# Patient Record
Sex: Female | Born: 1965
Health system: Southern US, Community
[De-identification: ages and names within clinical notes are randomized; demographics above are authoritative.]

## PROBLEM LIST (undated history)

## (undated) DIAGNOSIS — I10 Essential (primary) hypertension: Secondary | ICD-10-CM

## (undated) HISTORY — DX: Essential (primary) hypertension: I10

## (undated) HISTORY — PX: TUBAL LIGATION: SHX77

## (undated) HISTORY — PX: DENTAL SURGERY: SHX609

---

## 2001-01-01 ENCOUNTER — Encounter: Payer: Self-pay | Admitting: Emergency Medicine

## 2001-01-01 ENCOUNTER — Emergency Department (HOSPITAL_COMMUNITY): Admission: EM | Admit: 2001-01-01 | Discharge: 2001-01-01 | Payer: Self-pay | Admitting: *Deleted

## 2001-03-10 ENCOUNTER — Emergency Department (HOSPITAL_COMMUNITY): Admission: EM | Admit: 2001-03-10 | Discharge: 2001-03-10 | Payer: Self-pay | Admitting: Emergency Medicine

## 2003-01-26 ENCOUNTER — Ambulatory Visit (HOSPITAL_COMMUNITY): Admission: RE | Admit: 2003-01-26 | Discharge: 2003-01-26 | Payer: Self-pay | Admitting: Obstetrics and Gynecology

## 2003-01-26 ENCOUNTER — Encounter: Payer: Self-pay | Admitting: Obstetrics and Gynecology

## 2004-12-16 ENCOUNTER — Emergency Department (HOSPITAL_COMMUNITY): Admission: EM | Admit: 2004-12-16 | Discharge: 2004-12-16 | Payer: Self-pay | Admitting: Emergency Medicine

## 2005-04-07 ENCOUNTER — Emergency Department (HOSPITAL_COMMUNITY): Admission: EM | Admit: 2005-04-07 | Discharge: 2005-04-07 | Payer: Self-pay | Admitting: Emergency Medicine

## 2006-01-21 ENCOUNTER — Ambulatory Visit (HOSPITAL_COMMUNITY): Admission: RE | Admit: 2006-01-21 | Discharge: 2006-01-21 | Payer: Self-pay | Admitting: Obstetrics and Gynecology

## 2007-05-29 ENCOUNTER — Ambulatory Visit (HOSPITAL_COMMUNITY): Admission: RE | Admit: 2007-05-29 | Discharge: 2007-05-29 | Payer: Self-pay | Admitting: Obstetrics and Gynecology

## 2007-09-09 ENCOUNTER — Emergency Department (HOSPITAL_COMMUNITY): Admission: EM | Admit: 2007-09-09 | Discharge: 2007-09-10 | Payer: Self-pay | Admitting: Emergency Medicine

## 2008-06-02 ENCOUNTER — Ambulatory Visit (HOSPITAL_COMMUNITY): Admission: RE | Admit: 2008-06-02 | Discharge: 2008-06-02 | Payer: Self-pay | Admitting: Internal Medicine

## 2008-06-08 ENCOUNTER — Emergency Department (HOSPITAL_COMMUNITY): Admission: EM | Admit: 2008-06-08 | Discharge: 2008-06-08 | Payer: Self-pay | Admitting: Emergency Medicine

## 2009-06-08 ENCOUNTER — Ambulatory Visit (HOSPITAL_COMMUNITY): Admission: RE | Admit: 2009-06-08 | Discharge: 2009-06-08 | Payer: Self-pay | Admitting: Internal Medicine

## 2010-02-06 ENCOUNTER — Emergency Department (HOSPITAL_COMMUNITY)
Admission: EM | Admit: 2010-02-06 | Discharge: 2010-02-06 | Payer: Self-pay | Source: Home / Self Care | Admitting: Emergency Medicine

## 2010-02-24 ENCOUNTER — Encounter (HOSPITAL_COMMUNITY)
Admission: RE | Admit: 2010-02-24 | Discharge: 2010-03-26 | Payer: Self-pay | Source: Home / Self Care | Admitting: Preventative Medicine

## 2011-05-10 LAB — RAPID STREP SCREEN (MED CTR MEBANE ONLY): Streptococcus, Group A Screen (Direct): NEGATIVE

## 2011-05-10 LAB — STREP A DNA PROBE

## 2011-05-21 LAB — BASIC METABOLIC PANEL
BUN: 7
CO2: 27
Calcium: 9.3
Creatinine, Ser: 0.74
GFR calc non Af Amer: >60
Glucose, Bld: 89
Potassium: 3.7

## 2011-05-21 LAB — DIFFERENTIAL
Eosinophils Relative: 0
Lymphocytes Relative: 29
Lymphs Abs: 1.6
Monocytes Relative: 6
Neutro Abs: 3.6

## 2011-05-21 LAB — CBC
Hemoglobin: 11.4 — ABNORMAL LOW
RDW: 14.1
WBC: 5.6
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
## 2011-11-28 ENCOUNTER — Other Ambulatory Visit (HOSPITAL_COMMUNITY): Payer: Self-pay | Admitting: Internal Medicine

## 2011-11-28 DIAGNOSIS — Z139 Encounter for screening, unspecified: Secondary | ICD-10-CM

## 2011-12-04 ENCOUNTER — Ambulatory Visit (HOSPITAL_COMMUNITY): Payer: Self-pay

## 2011-12-10 ENCOUNTER — Ambulatory Visit (HOSPITAL_COMMUNITY)
Admission: RE | Admit: 2011-12-10 | Discharge: 2011-12-10 | Disposition: A | Discharge status: Home / Self Care | Payer: 59 | Source: Ambulatory Visit | Attending: Internal Medicine | Admitting: Internal Medicine

## 2011-12-10 DIAGNOSIS — Z139 Encounter for screening, unspecified: Secondary | ICD-10-CM

## 2011-12-10 DIAGNOSIS — Z1231 Encounter for screening mammogram for malignant neoplasm of breast: Secondary | ICD-10-CM | POA: Insufficient documentation

## 2017-11-21 ENCOUNTER — Ambulatory Visit (INDEPENDENT_AMBULATORY_CARE_PROVIDER_SITE_OTHER): Payer: BLUE CROSS/BLUE SHIELD | Admitting: Adult Health

## 2017-11-21 ENCOUNTER — Other Ambulatory Visit: Payer: Self-pay | Admitting: Adult Health

## 2017-11-21 ENCOUNTER — Encounter (INDEPENDENT_AMBULATORY_CARE_PROVIDER_SITE_OTHER): Payer: Self-pay

## 2017-11-21 ENCOUNTER — Other Ambulatory Visit (HOSPITAL_COMMUNITY)
Admission: RE | Admit: 2017-11-21 | Discharge: 2017-11-21 | Disposition: A | Discharge status: Home / Self Care | Payer: BLUE CROSS/BLUE SHIELD | Source: Ambulatory Visit | Attending: Adult Health | Admitting: Adult Health

## 2017-11-21 ENCOUNTER — Encounter: Payer: Self-pay | Admitting: Adult Health

## 2017-11-21 VITALS — BP 162/100 | HR 75 | Ht 65.0 in | Wt 203.0 lb

## 2017-11-21 DIAGNOSIS — R5383 Other fatigue: Secondary | ICD-10-CM | POA: Diagnosis not present

## 2017-11-21 DIAGNOSIS — Z1212 Encounter for screening for malignant neoplasm of rectum: Secondary | ICD-10-CM | POA: Diagnosis not present

## 2017-11-21 DIAGNOSIS — N951 Menopausal and female climacteric states: Secondary | ICD-10-CM | POA: Insufficient documentation

## 2017-11-21 DIAGNOSIS — Z1231 Encounter for screening mammogram for malignant neoplasm of breast: Secondary | ICD-10-CM

## 2017-11-21 DIAGNOSIS — F489 Nonpsychotic mental disorder, unspecified: Secondary | ICD-10-CM | POA: Diagnosis not present

## 2017-11-21 DIAGNOSIS — Z13220 Encounter for screening for lipoid disorders: Secondary | ICD-10-CM

## 2017-11-21 DIAGNOSIS — Z01419 Encounter for gynecological examination (general) (routine) without abnormal findings: Secondary | ICD-10-CM | POA: Insufficient documentation

## 2017-11-21 DIAGNOSIS — R232 Flushing: Secondary | ICD-10-CM | POA: Insufficient documentation

## 2017-11-21 DIAGNOSIS — R4589 Other symptoms and signs involving emotional state: Secondary | ICD-10-CM | POA: Insufficient documentation

## 2017-11-21 DIAGNOSIS — R030 Elevated blood-pressure reading, without diagnosis of hypertension: Secondary | ICD-10-CM | POA: Insufficient documentation

## 2017-11-21 DIAGNOSIS — Z1211 Encounter for screening for malignant neoplasm of colon: Secondary | ICD-10-CM | POA: Insufficient documentation

## 2017-11-21 DIAGNOSIS — Z01411 Encounter for gynecological examination (general) (routine) with abnormal findings: Secondary | ICD-10-CM | POA: Diagnosis not present

## 2017-11-21 LAB — HEMOCCULT GUIAC POC 1CARD (OFFICE): FECAL OCCULT BLD: NEGATIVE

## 2017-11-21 MED ORDER — LISINOPRIL-HYDROCHLOROTHIAZIDE 10-12.5 MG PO TABS: 1.0000 | ORAL_TABLET | Freq: Every day | ORAL | 6 refills | Status: DC

## 2017-11-21 NOTE — Progress Notes (Signed)
Patient ID: Emily Donaldson, female   DOB: October 15, 1965, 52 y.o.   MRN: [id number] History of Present Illness: Britta MccreedyBarbara is a 52 year old black female, married,G4P4, in for well woman gyn exam and pap. PCP is Dr Margo AyeHall.    Current Medications, Allergies, Past Medical History, Past Surgical History, Family History and Social History were reviewed in [facility] electronic medical record.     Review of Systems: Patient denies any hearing loss,  blurred vision(had eye exam at [facility]), shortness of breath, chest pain, abdominal pain, problems with bowel movements, urination, or intercourse. No joint pain. +moody,periods irregular, +hot flashes  +tired, not sleeping as well    Physical Exam:BP (!) 162/100 (BP Location: Right Arm, Cuff Size: Normal)   Pulse 75   Ht 5\' 5"  (1.651 m)   Wt 203 lb (92.1 kg)   BMI 33.78 kg/m  General:  Well developed, well nourished, no acute distress Skin:  Warm and dry Neck:  Midline trachea, normal thyroid, good ROM, no lymphadenopathy Lungs; Clear to auscultation bilaterally Breast:  No dominant palpable mass, retraction, or nipple discharge Cardiovascular: Regular rate and rhythm Abdomen:  Soft, non tender, no hepatosplenomegaly Pelvic:  External genitalia is normal in appearance, no lesions.  The vagina is normal in appearance. Urethra has no lesions or masses. The cervix is bulbous, and smooth, pap with HPV and GC/CHL performed.  Uterus is felt to be normal size, shape, and contour.  No adnexal masses or tenderness noted.Bladder is non tender, no masses felt. Rectal: Good sphincter tone, no polyps, or hemorrhoids felt.  Hemoccult negative. Extremities/musculoskeletal:  No swelling or varicosities noted, no clubbing or cyanosis Psych:  No mood changes, alert and cooperative,seems happy PHQ 9 score 6,denies being suicidal, moody. Will start BP meds, check labs and dicussed HRT vs SSRI  Impression: 1. Encounter for gynecological examination with  Papanicolaou smear of cervix   2. Screening for colorectal cancer   3. Elevated BP without diagnosis of hypertension   4. Hot flashes   5. Moody   6. Menopausal symptoms   7. Tired   8. Screening cholesterol level       Plan:  Check CBC,CMP,TSH and lipids Meds ordered this encounter  Medications  . lisinopril-hydrochlorothiazide (PRINZIDE,ZESTORETIC) 10-12.5 MG tablet    Sig: Take 1 tablet by mouth daily.    Dispense:  30 tablet    Refill:  6    Order Specific Question:   Supervising Provider    Answer:   Duane LopeEURE, LUTHER H [[id number]]   Review handouts on menopause and HRT.DASH diet  Follow up in 2 weeks for BP check  Physical in 1 year Pap in 3 if normal Get mammogram  Referred to Dr Darrick PennaFields for colonoscopy  Try to lose about 20 lbs,decrease sodas

## 2017-11-21 NOTE — Patient Instructions (Addendum)
Menopause and Hormone Replacement Therapy What is hormone replacement therapy? Hormone replacement therapy (HRT) is the use of artificial (synthetic) hormones to replace hormones that your body stops producing during menopause. Menopause is the normal time of life when menstrual periods stop completely and the ovaries stop producing the female hormones estrogen and progesterone. This lack of hormones can affect your health and cause undesirable symptoms. HRT can relieve some of those symptoms. What are my options for HRT? HRT may consist of the synthetic hormones estrogen and progestin, or it may consist of only estrogen (estrogen-only therapy). You and your health care provider will decide which form of HRT is best for you. If you choose to be on HRT and you have a uterus, estrogen and progestin are usually prescribed. Estrogen-only therapy is used for women who do not have a uterus. Possible options for taking HRT include:  Pills.  Patches. G DASH Eating Plan DASH stands for "Dietary Approaches to Stop Hypertension." The DASH eating plan is a healthy eating plan that has been shown to reduce high blood pressure (hypertension). It may also reduce your risk for type 2 diabetes, heart disease, and stroke. The DASH eating plan may also help with weight loss. What are tips for following this plan? General guidelines Avoid eating more than 2,300 mg (milligrams) of salt (sodium) a day. If you have hypertension, you may need to reduce your sodium intake to 1,500 mg a day. Limit alcohol intake to no more than 1 drink a day for nonpregnant women and 2 drinks a day for men. One drink equals 12 oz of beer, 5 oz of wine, or 1 oz of hard liquor. Work with your health care provider to maintain a healthy body weight or to lose weight. Ask what an ideal weight is for you. Get at least 30 minutes of exercise that causes your heart to beat faster (aerobic exercise) most days of the week. Activities may include  walking, swimming, or biking. Work with your health care provider or diet and nutrition specialist (dietitian) to adjust your eating plan to your individual calorie needs. Reading food labels Check food labels for the amount of sodium per serving. Choose foods with less than 5 percent of the Daily Value of sodium. Generally, foods with less than 300 mg of sodium per serving fit into this eating plan. To find whole grains, look for the word "whole" as the first word in the ingredient list. Shopping Buy products labeled as "low-sodium" or "no salt added." Buy fresh foods. Avoid canned foods and premade or frozen meals. Cooking Avoid adding salt when cooking. Use salt-free seasonings or herbs instead of table salt or sea salt. Check with your health care provider or pharmacist before using salt substitutes. Do not fry foods. Cook foods using healthy methods such as baking, boiling, grilling, and broiling instead. Cook with heart-healthy oils, such as olive, canola, soybean, or sunflower oil. Meal planning  Eat a balanced diet that includes: 5 or more servings of fruits and vegetables each day. At each meal, try to fill half of your plate with fruits and vegetables. Up to 6-8 servings of whole grains each day. Less than 6 oz of lean meat, poultry, or fish each day. A 3-oz serving of meat is about the same size as a deck of cards. One egg equals 1 oz. 2 servings of low-fat dairy each day. A serving of nuts, seeds, or beans 5 times each week. Heart-healthy fats. Healthy fats called Omega-3 fatty acids are  found in foods such as flaxseeds and coldwater fish, like sardines, salmon, and mackerel. Limit how much you eat of the following: Canned or prepackaged foods. Food that is high in trans fat, such as fried foods. Food that is high in saturated fat, such as fatty meat. Sweets, desserts, sugary drinks, and other foods with added sugar. Full-fat dairy products. Do not salt foods before  eating. Try to eat at least 2 vegetarian meals each week. Eat more home-cooked food and less restaurant, buffet, and fast food. When eating at a restaurant, ask that your food be prepared with less salt or no salt, if possible. What foods are recommended? The items listed may not be a complete list. Talk with your dietitian about what dietary choices are best for you. Grains Whole-grain or whole-wheat bread. Whole-grain or whole-wheat pasta. Brown rice. Orpah Cobbatmeal. Quinoa. Bulgur. Whole-grain and low-sodium cereals. Pita bread. Low-fat, low-sodium crackers. Whole-wheat flour tortillas. Vegetables Fresh or frozen vegetables (raw, steamed, roasted, or grilled). Low-sodium or reduced-sodium tomato and vegetable juice. Low-sodium or reduced-sodium tomato sauce and tomato paste. Low-sodium or reduced-sodium canned vegetables. Fruits All fresh, dried, or frozen fruit. Canned fruit in natural juice (without added sugar). Meat and other protein foods Skinless chicken or Malaw[place]urkey. Ground chicken or [place]urkey. Pork with fat trimmed off. Fish and seafood. Egg whites. Dried beans, peas, or lentils. Unsalted nuts, nut butters, and seeds. Unsalted canned beans. Lean cuts of beef with fat trimmed off. Low-sodium, lean deli meat. Dairy Low-fat (1%) or fat-free (skim) milk. Fat-free, low-fat, or reduced-fat cheeses. Nonfat, low-sodium ricotta or cottage cheese. Low-fat or nonfat yogurt. Low-fat, low-sodium cheese. Fats and oils Soft margarine without trans fats. Vegetable oil. Low-fat, reduced-fat, or light mayonnaise and salad dressings (reduced-sodium). Canola, safflower, olive, soybean, and sunflower oils. Avocado. Seasoning and other foods Herbs. Spices. Seasoning mixes without salt. Unsalted popcorn and pretzels. Fat-free sweets. What foods are not recommended? The items listed may not be a complete list. Talk with your dietitian about what dietary choices are best for you. Grains Baked goods made with fat, such  as croissants, muffins, or some breads. Dry pasta or rice meal packs. Vegetables Creamed or fried vegetables. Vegetables in a cheese sauce. Regular canned vegetables (not low-sodium or reduced-sodium). Regular canned tomato sauce and paste (not low-sodium or reduced-sodium). Regular tomato and vegetable juice (not low-sodium or reduced-sodium). Rosita FirePickles. Olives. Fruits Canned fruit in a light or heavy syrup. Fried fruit. Fruit in cream or butter sauce. Meat and other protein foods Fatty cuts of meat. Ribs. Fried meat. Tomasa BlaseBacon. Sausage. Bologna and other processed lunch meats. Salami. Fatback. Hotdogs. Bratwurst. Salted nuts and seeds. Canned beans with added salt. Canned or smoked fish. Whole eggs or egg yolks. Chicken or Malaw[place]urkey with skin. Dairy Whole or 2% milk, cream, and half-and-half. Whole or full-fat cream cheese. Whole-fat or sweetened yogurt. Full-fat cheese. Nondairy creamers. Whipped toppings. Processed cheese and cheese spreads. Fats and oils Butter. Stick margarine. Lard. Shortening. Ghee. Bacon fat. Tropical oils, such as coconut, palm kernel, or palm oil. Seasoning and other foods Salted popcorn and pretzels. Onion salt, garlic salt, seasoned salt, table salt, and sea salt. Worcestershire sauce. Tartar sauce. Barbecue sauce. Teriyaki sauce. Soy sauce, including reduced-sodium. Steak sauce. Canned and packaged gravies. Fish sauce. Oyster sauce. Cocktail sauce. Horseradish that you find on the shelf. Ketchup. Mustard. Meat flavorings and tenderizers. Bouillon cubes. Hot sauce and Tabasco sauce. Premade or packaged marinades. Premade or packaged taco seasonings. Relishes. Regular salad dressings. Where to find more information: [employer]  Heart, Lung, and Blood Institute: PopSteam.is American Heart Association: www.heart.org Summary The DASH eating plan is a healthy eating plan that has been shown to reduce high blood pressure (hypertension). It may also reduce your risk for type 2  diabetes, heart disease, and stroke. With the DASH eating plan, you should limit salt (sodium) intake to 2,300 mg a day. If you have hypertension, you may need to reduce your sodium intake to 1,500 mg a day. When on the DASH eating plan, aim to eat more fresh fruits and vegetables, whole grains, lean proteins, low-fat dairy, and heart-healthy fats. Work with your health care provider or diet and nutrition specialist (dietitian) to adjust your eating plan to your individual calorie needs. This information is not intended to replace advice given to you by your health care provider. Make sure you discuss any questions you have with your health care provider. Document Released: 07/26/2011 Document Revised: 07/30/2016 Document Reviewed: 07/30/2016 Elsevier Interactive Patient Education  2018 [employer].  [place].  Sprays.  Vaginal cream.  Vaginal rings.  Vaginal inserts.  The amount of hormone(s) that you take and how long you take the hormone(s) varies depending on your individual health. It is important to:  Begin HRT with the lowest possible dosage.  Stop HRT as soon as your health care provider tells you to stop.  Work with your health care provider so that you feel informed and comfortable with your decisions.  What are the benefits of HRT? HRT can reduce the frequency and severity of menopausal symptoms. Benefits of HRT vary depending on the menopausal symptoms that you have, the severity of your symptoms, and your overall health. HRT may help to improve the following menopausal symptoms:  Hot flashes and night sweats. These are sudden feelings of heat that spread over the face and body. The skin may turn red, like a blush. Night sweats are hot flashes that happen while you are sleeping or trying to sleep.  Bone loss (osteoporosis). The body loses calcium more quickly after menopause, causing the bones to become weaker. This can increase the risk for bone breaks  (fractures).  Vaginal dryness. The lining of the vagina can become thin and dry, which can cause pain during sexual intercourse or cause infection, burning, or itching.  Urinary tract infections.  Urinary incontinence. This is a decreased ability to control when you urinate.  Irritability.  Short-term memory problems.  What are the risks of HRT? Risks of HRT vary depending on your individual health and medical history. Risks of HRT also depend on whether you receive both estrogen and progestin or you receive estrogen only.HRT may increase the risk of:  Spotting. This is when a small amount of bloodleaks from the vagina unexpectedly.  Endometrial cancer. This cancer is in the lining of the uterus (endometrium).  Breast cancer.  Increased density of breast tissue. This can make it harder to find breast cancer on a breast X-ray (mammogram).  Stroke.  Heart attack.  Blood clots.  Gallbladder disease.  Risks of HRT can increase if you have any of the following conditions:  Endometrial cancer.  Liver disease.  Heart disease.  Breast cancer.  History of blood clots.  History of stroke.  How should I care for myself while I am on HRT?  Take over-the-counter and prescription medicines only as told by your health care provider.  Get mammograms, pelvic exams, and medical checkups as often as told by your health care provider.  Have Pap tests done as often  as told by your health care provider. A Pap test is sometimes called a Pap smear. It is a screening test that is used to check for signs of cancer of the cervix and vagina. A Pap test can also identify the presence of infection or precancerous changes. Pap tests may be done: ? Every 3 years, starting at age 28. ? Every 5 years, starting after age 4, in combination with testing for human papillomavirus (HPV). ? More often or less often depending on other medical conditions you have, your age, and other risk  factors.  It is your responsibility to get your Pap test results. Ask your health care provider or the department performing the test when your results will be ready.  Keep all follow-up visits as told by your health care provider. This is important. When should I seek medical care? Talk with your health care provider if:  You have any of these: ? Pain or swelling in your legs. ? Shortness of breath. ? Chest pain. ? Lumps or changes in your breasts or armpits. ? Slurred speech. ? Pain, burning, or bleeding when you urine.  You develop any of these: ? Unusual vaginal bleeding. ? Dizziness or headaches. ? Weakness or numbness in any part of your arms or legs. ? Pain in your abdomen.  This information is not intended to replace advice given to you by your health care provider. Make sure you discuss any questions you have with your health care provider. Document Released: 05/05/2003 Document Revised: 07/03/2016 Document Reviewed: 02/07/2015 Elsevier Interactive Patient Education  2017 Elsevier Inc. Menopause Menopause is the normal time of life when menstrual periods stop completely. Menopause is complete when you have missed 12 consecutive menstrual periods. It usually occurs between the ages of 48 years and 55 years. Very rarely does a woman develop menopause before the age of 40 years. At menopause, your ovaries stop producing the female hormones estrogen and progesterone. This can cause undesirable symptoms and also affect your health. Sometimes the symptoms may occur 4-5 years before the menopause begins. There is no relationship between menopause and:  Oral contraceptives.  Number of children you had.  Race.  The age your menstrual periods started (menarche).  Heavy smokers and very thin women may develop menopause earlier in life. What are the causes?  The ovaries stop producing the female hormones estrogen and progesterone. Other causes include:  Surgery to remove both  ovaries.  The ovaries stop functioning for no known reason.  Tumors of the pituitary gland in the brain.  Medical disease that affects the ovaries and hormone production.  Radiation treatment to the abdomen or pelvis.  Chemotherapy that affects the ovaries.  What are the signs or symptoms?  Hot flashes.  Night sweats.  Decrease in sex drive.  Vaginal dryness and thinning of the vagina causing painful intercourse.  Dryness of the skin and developing wrinkles.  Headaches.  Tiredness.  Irritability.  Memory problems.  Weight gain.  Bladder infections.  Hair growth of the face and chest.  Infertility. More serious symptoms include:  Loss of bone (osteoporosis) causing breaks (fractures).  Depression.  Hardening and narrowing of the arteries (atherosclerosis) causing heart attacks and strokes.  How is this diagnosed?  When the menstrual periods have stopped for 12 straight months.  Physical exam.  Hormone studies of the blood. How is this treated? There are many treatment choices and nearly as many questions about them. The decisions to treat or not to treat menopausal changes is  an individual choice made with your health care provider. Your health care provider can discuss the treatments with you. Together, you can decide which treatment will work best for you. Your treatment choices may include:  Hormone therapy (estrogen and progesterone).  Non-hormonal medicines.  Treating the individual symptoms with medicine (for example antidepressants for depression).  Herbal medicines that may help specific symptoms.  Counseling by a psychiatrist or psychologist.  Group therapy.  Lifestyle changes including: ? Eating healthy. ? Regular exercise. ? Limiting caffeine and alcohol. ? Stress management and meditation.  No treatment.  Follow these instructions at home:  Take the medicine your health care provider gives you as directed.  Get plenty of  sleep and rest.  Exercise regularly.  Eat a diet that contains calcium (good for the bones) and soy products (acts like estrogen hormone).  Avoid alcoholic beverages.  Do not smoke.  If you have hot flashes, dress in layers.  Take supplements, calcium, and vitamin D to strengthen bones.  You can use over-the-counter lubricants or moisturizers for vaginal dryness.  Group therapy is sometimes very helpful.  Acupuncture may be helpful in some cases. Contact a health care provider if:  You are not sure you are in menopause.  You are having menopausal symptoms and need advice and treatment.  You are still having menstrual periods after age 40 years.  You have pain with intercourse.  Menopause is complete (no menstrual period for 12 months) and you develop vaginal bleeding.  You need a referral to a specialist (gynecologist, psychiatrist, or psychologist) for treatment. Get help right away if:  You have severe depression.  You have excessive vaginal bleeding.  You fell and think you have a broken bone.  You have pain when you urinate.  You develop leg or chest pain.  You have a fast pounding heart beat (palpitations).  You have severe headaches.  You develop vision problems.  You feel a lump in your breast.  You have abdominal pain or severe indigestion. This information is not intended to replace advice given to you by your health care provider. Make sure you discuss any questions you have with your health care provider. Document Released: 10/27/2003 Document Revised: 01/12/2016 Document Reviewed: 03/05/2013 Elsevier Interactive Patient Education  2017 [employer].

## 2017-11-22 LAB — CYTOLOGY - PAP
Chlamydia: NEGATIVE
DIAGNOSIS: NEGATIVE
HPV: NOT DETECTED
Neisseria Gonorrhea: NEGATIVE

## 2017-11-22 LAB — CBC
HEMATOCRIT: 33.4 % — AB (ref 34.0–46.6)
HEMOGLOBIN: 11.2 g/dL (ref 11.1–15.9)
MCH: 27.2 pg (ref 26.6–33.0)
MCHC: 33.5 g/dL (ref 31.5–35.7)
MCV: 81 fL (ref 79–97)
Platelets: 231 10*3/uL (ref 150–379)
RBC: 4.12 x10E6/uL (ref 3.77–5.28)
RDW: 16.2 % — ABNORMAL HIGH (ref 12.3–15.4)
WBC: 3.8 10*3/uL (ref 3.4–10.8)

## 2017-11-22 LAB — LIPID PANEL
Chol/HDL Ratio: 3.6 ratio (ref 0.0–4.4)
Cholesterol, Total: 196 mg/dL (ref 100–199)
HDL: 54 mg/dL (ref >39)
LDL CALC: 125 mg/dL — AB (ref 0–99)
Triglycerides: 85 mg/dL (ref 0–149)
VLDL CHOLESTEROL CAL: 17 mg/dL (ref 5–40)

## 2017-11-22 LAB — COMPREHENSIVE METABOLIC PANEL
ALBUMIN: 4.3 g/dL (ref 3.5–5.5)
ALT: 11 IU/L (ref 0–32)
AST: 15 IU/L (ref 0–40)
Albumin/Globulin Ratio: 1.2 (ref 1.2–2.2)
Alkaline Phosphatase: 49 IU/L (ref 39–117)
BILIRUBIN TOTAL: 0.3 mg/dL (ref 0.0–1.2)
BUN / CREAT RATIO: 12 (ref 9–23)
BUN: 10 mg/dL (ref 6–24)
CHLORIDE: 106 mmol/L (ref 96–106)
CO2: 23 mmol/L (ref 20–29)
CREATININE: 0.81 mg/dL (ref 0.57–1.00)
Calcium: 9.4 mg/dL (ref 8.7–10.2)
GFR calc non Af Amer: 84 mL/min/{1.73_m2} (ref >59)
GFR, EST AFRICAN AMERICAN: 97 mL/min/{1.73_m2} (ref >59)
GLOBULIN, TOTAL: 3.7 g/dL (ref 1.5–4.5)
GLUCOSE: 84 mg/dL (ref 65–99)
Potassium: 3.9 mmol/L (ref 3.5–5.2)
SODIUM: 143 mmol/L (ref 134–144)
TOTAL PROTEIN: 8 g/dL (ref 6.0–8.5)

## 2017-11-22 LAB — TSH: TSH: 1.7 u[IU]/mL (ref 0.450–4.500)

## 2017-11-27 ENCOUNTER — Ambulatory Visit (HOSPITAL_COMMUNITY)
Admission: RE | Admit: 2017-11-27 | Discharge: 2017-11-27 | Disposition: A | Discharge status: Home / Self Care | Payer: BLUE CROSS/BLUE SHIELD | Source: Ambulatory Visit | Attending: Adult Health | Admitting: Adult Health

## 2017-11-27 ENCOUNTER — Encounter (HOSPITAL_COMMUNITY): Payer: Self-pay

## 2017-11-27 ENCOUNTER — Telehealth: Payer: Self-pay | Admitting: Adult Health

## 2017-11-27 DIAGNOSIS — R928 Other abnormal and inconclusive findings on diagnostic imaging of breast: Secondary | ICD-10-CM | POA: Diagnosis not present

## 2017-11-27 DIAGNOSIS — Z1231 Encounter for screening mammogram for malignant neoplasm of breast: Secondary | ICD-10-CM | POA: Insufficient documentation

## 2017-11-27 NOTE — Telephone Encounter (Signed)
Left message that labs look good and pap was negative for malignancy and HPV/CG/CHL

## 2017-12-05 ENCOUNTER — Ambulatory Visit: Payer: BLUE CROSS/BLUE SHIELD | Admitting: Adult Health

## 2017-12-09 ENCOUNTER — Other Ambulatory Visit: Payer: Self-pay | Admitting: Adult Health

## 2017-12-09 DIAGNOSIS — R928 Other abnormal and inconclusive findings on diagnostic imaging of breast: Secondary | ICD-10-CM

## 2017-12-12 ENCOUNTER — Encounter: Payer: Self-pay | Admitting: Adult Health

## 2017-12-12 ENCOUNTER — Ambulatory Visit (INDEPENDENT_AMBULATORY_CARE_PROVIDER_SITE_OTHER): Payer: BLUE CROSS/BLUE SHIELD | Admitting: Adult Health

## 2017-12-12 VITALS — BP 118/60 | HR 97 | Ht 65.0 in | Wt 199.0 lb

## 2017-12-12 DIAGNOSIS — I10 Essential (primary) hypertension: Secondary | ICD-10-CM | POA: Diagnosis not present

## 2017-12-12 NOTE — Progress Notes (Signed)
Subjective:     Patient ID: Emily Donaldson, female   DOB: 1966/05/22, 52 y.o.   MRN: [id number]  HPI Emily Donaldson is a 52 year old black female in for BP check.She had mammogram 4/10 and had some asymmetry in left breast and has to go back for F/U mammogram and US.She has appt with Dr Darrick PennaFields office to get colonoscopy set up too.   Review of Systems No headaches Hot flashes better Not as tired, feels much better  Reviewed past medical,surgical, social and family history. Reviewed medications and allergies.     Objective:   Physical Exam BP 118/60 (BP Location: Right Arm, Patient Position: Sitting, Cuff Size: Large)   Pulse 97   Ht 5\' 5"  (1.651 m)   Wt 199 lb (90.3 kg)   BMI 33.12 kg/m  Skin warm and dry.  Lungs: clear to ausculation bilaterally. Cardiovascular: regular rate and rhythm.   BP much better and has lost 4 lbs. She declines HRT at this time.   Assessment:     1. Essential hypertension       Plan:     Continue lisinopril-hydrochlorothiazide  F/U in 3 months Continue weight loss efforts

## 2017-12-17 ENCOUNTER — Encounter (HOSPITAL_COMMUNITY): Payer: Self-pay

## 2017-12-17 ENCOUNTER — Ambulatory Visit (HOSPITAL_COMMUNITY)
Admission: RE | Admit: 2017-12-17 | Discharge: 2017-12-17 | Disposition: A | Discharge status: Home / Self Care | Payer: BLUE CROSS/BLUE SHIELD | Source: Ambulatory Visit | Attending: Adult Health | Admitting: Adult Health

## 2017-12-17 DIAGNOSIS — R928 Other abnormal and inconclusive findings on diagnostic imaging of breast: Secondary | ICD-10-CM | POA: Diagnosis not present

## 2017-12-19 ENCOUNTER — Ambulatory Visit: Payer: BLUE CROSS/BLUE SHIELD

## 2018-01-09 ENCOUNTER — Ambulatory Visit (INDEPENDENT_AMBULATORY_CARE_PROVIDER_SITE_OTHER): Payer: Self-pay

## 2018-01-09 DIAGNOSIS — Z1211 Encounter for screening for malignant neoplasm of colon: Secondary | ICD-10-CM

## 2018-01-09 MED ORDER — PEG 3350-KCL-NA BICARB-NACL 420 G PO SOLR: 4000.0000 mL | ORAL | 0 refills | Status: DC

## 2018-01-09 NOTE — Patient Instructions (Signed)
Emily Donaldson   11-02-1965 MRN: [id number]    Procedure Date: 03/24/18 Time to register: 8:15am Place to register: [facility] Procedure Time: 9:15am Scheduled provider: Barney Drain, MD  PREPARATION FOR COLONOSCOPY WITH TRI-LYTE SPLIT PREP  Please notify us immediately if you are diabetic, take iron supplements, or if you are on Coumadin or any other blood thinners.     You will need to purchase 1 fleet enema and 1 box of Bisacodyl 41m tablets.    1 DAY BEFORE PROCEDURE:  DATE: 03/23/18   DAY: Sunday   clear liquids the entire day - NO SOLID FOOD.     At 2:00 pm:  Take 2 Bisacodyl tablets.   At 4:00pm:  Start drinking your solution. Make sure you mix well per instructions on the bottle. Try to drink 1 (one) 8 ounce glass every 10-15 minutes until you have consumed HALF the jug. You should complete by 6:00pm.You must keep the left over solution refrigerated until completed next day.  Continue clear liquids. You must drink plenty of clear liquids to prevent dehyration and kidney failure.   If you take medications for your heart, blood pressure or breathing, you may take these medications with a clear liquid.    DAY OF PROCEDURE:   DATE: 03/24/18   DAY: Monday    Five hours before your procedure time @ 4:15am:  Finish remaining amout of bowel prep, drinking 1 (one) 8 ounce glass every 10-15 minutes until complete. You have two hours to consume remaining prep.   Three hours before your procedure time '@6' :15am:  Nothing by mouth.   At least one hour before going to the hospital:  Give yourself one Fleet enema. You may take your morning medications with sip of water unless we have instructed otherwise.      Please see below for Dietary Information.  CLEAR LIQUIDS INCLUDE:  Water Jello (NOT red in color)   Ice Popsicles (NOT red in color)   Tea (sugar ok, no milk/cream) Powdered fruit flavored drinks  Coffee (sugar ok, no milk/cream) Gatorade/ Lemonade/ Kool-Aid   (NOT red in color)   Juice: apple, white grape, white cranberry Soft drinks  Clear bullion, consomme, broth (fat free beef/chicken/vegetable)  Carbonated beverages (any kind)  Strained chicken noodle soup Hard Candy   Remember: Clear liquids are liquids that will allow you to see your fingers on the other side of a clear glass. Be sure liquids are NOT red in color, and not cloudy, but CLEAR.  DO NOT EAT OR DRINK ANY OF THE FOLLOWING:  Dairy products of any kind   Cranberry juice Tomato juice / V8 juice   Grapefruit juice Orange juice     Red grape juice  Do not eat any solid foods, including such foods as: cereal, oatmeal, yogurt, fruits, vegetables, creamed soups, eggs, bread, crackers, pureed foods in a blender, etc.   HELPFUL HINTS FOR DRINKING PREP SOLUTION:   Make sure prep is extremely cold. Mix and refrigerate the the morning of the prep. You may also put in the freezer.   You may try mixing some Crystal Light or Country Time Lemonade if you prefer. Mix in small amounts; add more if necessary.  Try drinking through a straw  Rinse mouth with water or a mouthwash between glasses, to remove after-taste.  Try sipping on a cold beverage /ice/ popsicles between glasses of prep.  Place a piece of sugar-free hard candy in mouth between glasses.  If you become nauseated,  try consuming smaller amounts, or stretch out the time between glasses. Stop for 30-60 minutes, then slowly start back drinking.        OTHER INSTRUCTIONS  You will need a responsible adult at least 52 years of age to accompany you and drive you home. This person must remain in the waiting room during your procedure. The hospital will cancel your procedure if you do not have a responsible adult with you.   1. Wear loose fitting clothing that is easily removed. 2. Leave jewelry and other valuables at home.  3. Remove all body piercing jewelry and leave at home. 4. Total time from sign-in until discharge is  approximately 2-3 hours. 5. You should go home directly after your procedure and rest. You can resume normal activities the day after your procedure. 6. The day of your procedure you should not:  Drive  Make legal decisions  Operate machinery  Drink alcohol  Return to work   You may call the office (Dept: [phone]) before 5:00pm, or page the doctor on call [phone]) after 5:00pm, for further instructions, if necessary.   Insurance Information YOU WILL NEED TO CHECK WITH YOUR INSURANCE COMPANY FOR THE BENEFITS OF COVERAGE YOU HAVE FOR THIS PROCEDURE.  UNFORTUNATELY, NOT ALL INSURANCE COMPANIES HAVE BENEFITS TO COVER ALL OR PART OF THESE TYPES OF PROCEDURES.  IT IS YOUR RESPONSIBILITY TO CHECK YOUR BENEFITS, HOWEVER, WE WILL BE GLAD TO ASSIST YOU WITH ANY CODES YOUR INSURANCE COMPANY MAY NEED.    PLEASE NOTE THAT MOST INSURANCE COMPANIES WILL NOT COVER A SCREENING COLONOSCOPY FOR PEOPLE UNDER THE AGE OF 50  IF YOU HAVE BCBS INSURANCE, YOU MAY HAVE BENEFITS FOR A SCREENING COLONOSCOPY BUT IF POLYPS ARE FOUND THE DIAGNOSIS WILL CHANGE AND THEN YOU MAY HAVE A DEDUCTIBLE THAT WILL NEED TO BE MET. SO PLEASE MAKE SURE YOU CHECK YOUR BENEFITS FOR A SCREENING COLONOSCOPY AS WELL AS A DIAGNOSTIC COLONOSCOPY.

## 2018-01-09 NOTE — Progress Notes (Addendum)
Gastroenterology Pre-Procedure Review  Request Date:01/09/18 Requesting Physician: Cyril Mourning NP  ( no previous tcs)  PATIENT REVIEW QUESTIONS: The patient responded to the following health history questions as indicated:    1. Diabetes Melitis: no 2. Joint replacements in the past 12 months: no 3. Major health problems in the past 3 months: no 4. Has an artificial valve or MVP: no 5. Has a defibrillator: no 6. Has been advised in past to take antibiotics in advance of a procedure like teeth cleaning: no 7. Family history of colon cancer: no  8. Alcohol Use: no 9. History of sleep apnea: no  10. History of coronary artery or other vascular stents placed within the last 12 months: no 11. History of any prior anesthesia complications: no    MEDICATIONS & ALLERGIES:    Patient reports the following regarding taking any blood thinners:   Plavix? no Aspirin? no Coumadin? no Brilinta? no Xarelto? no Eliquis? no Pradaxa? no Savaysa? no Effient? no  Patient confirms/reports the following medications:  Current Outpatient Medications  Medication Sig Dispense Refill  . lisinopril-hydrochlorothiazide (PRINZIDE,ZESTORETIC) 10-12.5 MG tablet Take 1 tablet by mouth daily. 30 tablet 6   No current facility-administered medications for this visit.     Patient confirms/reports the following allergies:  No Known Allergies  No orders of the defined types were placed in this encounter.   AUTHORIZATION INFORMATION Primary Insurance: [place] ,  ID #: [id number] Pre-Cert / Berkley Harvey required: no Pre-Cert / Auth #: Jomarie Longs [id number] [id number]   SCHEDULE INFORMATION: Procedure has been scheduled as follows:  Date: 03/24/18, Time: 9:15 Location: [facility]  This Gastroenterology Pre-Precedure Review Form is being routed to the following provider(s): Tana Coast, PA

## 2018-01-10 NOTE — Progress Notes (Signed)
OK to schedule

## 2018-03-13 ENCOUNTER — Ambulatory Visit: Payer: BLUE CROSS/BLUE SHIELD | Admitting: Adult Health

## 2018-03-18 NOTE — Progress Notes (Signed)
Patient called and cancelled her procedure, because her husband is having surgery in August.   She will call back later to reschedule.  I left a message for Eber JonesCarolyn to cancel her tcs.

## 2018-03-24 ENCOUNTER — Ambulatory Visit (HOSPITAL_COMMUNITY): Admit: 2018-03-24 | Payer: BLUE CROSS/BLUE SHIELD | Admitting: Gastroenterology

## 2018-03-24 ENCOUNTER — Encounter (HOSPITAL_COMMUNITY): Payer: Self-pay

## 2018-03-24 SURGERY — COLONOSCOPY
Anesthesia: Moderate Sedation

## 2018-04-02 ENCOUNTER — Ambulatory Visit: Payer: BLUE CROSS/BLUE SHIELD | Admitting: Adult Health

## 2018-04-28 ENCOUNTER — Ambulatory Visit (INDEPENDENT_AMBULATORY_CARE_PROVIDER_SITE_OTHER): Payer: BLUE CROSS/BLUE SHIELD | Admitting: Adult Health

## 2018-04-28 ENCOUNTER — Encounter: Payer: Self-pay | Admitting: Adult Health

## 2018-04-28 VITALS — BP 152/98 | HR 71 | Ht 64.0 in | Wt 206.4 lb

## 2018-04-28 DIAGNOSIS — I10 Essential (primary) hypertension: Secondary | ICD-10-CM

## 2018-04-28 MED ORDER — AMLODIPINE BESYLATE 10 MG PO TABS: 10.0000 mg | ORAL_TABLET | Freq: Every day | ORAL | 3 refills | Status: DC

## 2018-04-28 NOTE — Patient Instructions (Signed)
DASH Eating Plan DASH stands for "Dietary Approaches to Stop Hypertension." The DASH eating plan is a healthy eating plan that has been shown to reduce high blood pressure (hypertension). It may also reduce your risk for type 2 diabetes, heart disease, and stroke. The DASH eating plan may also help with weight loss. What are tips for following this plan? General guidelines  Avoid eating more than 2,300 mg (milligrams) of salt (sodium) a day. If you have hypertension, you may need to reduce your sodium intake to 1,500 mg a day.  Limit alcohol intake to no more than 1 drink a day for nonpregnant women and 2 drinks a day for men. One drink equals 12 oz of beer, 5 oz of wine, or 1 oz of hard liquor.  Work with your health care provider to maintain a healthy body weight or to lose weight. Ask what an ideal weight is for you.  Get at least 30 minutes of exercise that causes your heart to beat faster (aerobic exercise) most days of the week. Activities may include walking, swimming, or biking.  Work with your health care provider or diet and nutrition specialist (dietitian) to adjust your eating plan to your individual calorie needs. Reading food labels  Check food labels for the amount of sodium per serving. Choose foods with less than 5 percent of the Daily Value of sodium. Generally, foods with less than 300 mg of sodium per serving fit into this eating plan.  To find whole grains, look for the word "whole" as the first word in the ingredient list. Shopping  Buy products labeled as "low-sodium" or "no salt added."  Buy fresh foods. Avoid canned foods and premade or frozen meals. Cooking  Avoid adding salt when cooking. Use salt-free seasonings or herbs instead of table salt or sea salt. Check with your health care provider or pharmacist before using salt substitutes.  Do not fry foods. Cook foods using healthy methods such as baking, boiling, grilling, and broiling instead.  Cook with  heart-healthy oils, such as olive, canola, soybean, or sunflower oil. Meal planning   Eat a balanced diet that includes: ? 5 or more servings of fruits and vegetables each day. At each meal, try to fill half of your plate with fruits and vegetables. ? Up to 6-8 servings of whole grains each day. ? Less than 6 oz of lean meat, poultry, or fish each day. A 3-oz serving of meat is about the same size as a deck of cards. One egg equals 1 oz. ? 2 servings of low-fat dairy each day. ? A serving of nuts, seeds, or beans 5 times each week. ? Heart-healthy fats. Healthy fats called Omega-3 fatty acids are found in foods such as flaxseeds and coldwater fish, like sardines, salmon, and mackerel.  Limit how much you eat of the following: ? Canned or prepackaged foods. ? Food that is high in trans fat, such as fried foods. ? Food that is high in saturated fat, such as fatty meat. ? Sweets, desserts, sugary drinks, and other foods with added sugar. ? Full-fat dairy products.  Do not salt foods before eating.  Try to eat at least 2 vegetarian meals each week.  Eat more home-cooked food and less restaurant, buffet, and fast food.  When eating at a restaurant, ask that your food be prepared with less salt or no salt, if possible. What foods are recommended? The items listed may not be a complete list. Talk with your dietitian about what   dietary choices are best for you. Grains Whole-grain or whole-wheat bread. Whole-grain or whole-wheat pasta. Brown rice. Oatmeal. Quinoa. Bulgur. Whole-grain and low-sodium cereals. Pita bread. Low-fat, low-sodium crackers. Whole-wheat flour tortillas. Vegetables Fresh or frozen vegetables (raw, steamed, roasted, or grilled). Low-sodium or reduced-sodium tomato and vegetable juice. Low-sodium or reduced-sodium tomato sauce and tomato paste. Low-sodium or reduced-sodium canned vegetables. Fruits All fresh, dried, or frozen fruit. Canned fruit in natural juice (without  added sugar). Meat and other protein foods Skinless chicken or turkey. Ground chicken or turkey. Pork with fat trimmed off. Fish and seafood. Egg whites. Dried beans, peas, or lentils. Unsalted nuts, nut butters, and seeds. Unsalted canned beans. Lean cuts of beef with fat trimmed off. Low-sodium, lean deli meat. Dairy Low-fat (1%) or fat-free (skim) milk. Fat-free, low-fat, or reduced-fat cheeses. Nonfat, low-sodium ricotta or cottage cheese. Low-fat or nonfat yogurt. Low-fat, low-sodium cheese. Fats and oils Soft margarine without trans fats. Vegetable oil. Low-fat, reduced-fat, or light mayonnaise and salad dressings (reduced-sodium). Canola, safflower, olive, soybean, and sunflower oils. Avocado. Seasoning and other foods Herbs. Spices. Seasoning mixes without salt. Unsalted popcorn and pretzels. Fat-free sweets. What foods are not recommended? The items listed may not be a complete list. Talk with your dietitian about what dietary choices are best for you. Grains Baked goods made with fat, such as croissants, muffins, or some breads. Dry pasta or rice meal packs. Vegetables Creamed or fried vegetables. Vegetables in a cheese sauce. Regular canned vegetables (not low-sodium or reduced-sodium). Regular canned tomato sauce and paste (not low-sodium or reduced-sodium). Regular tomato and vegetable juice (not low-sodium or reduced-sodium). Pickles. Olives. Fruits Canned fruit in a light or heavy syrup. Fried fruit. Fruit in cream or butter sauce. Meat and other protein foods Fatty cuts of meat. Ribs. Fried meat. Bacon. Sausage. Bologna and other processed lunch meats. Salami. Fatback. Hotdogs. Bratwurst. Salted nuts and seeds. Canned beans with added salt. Canned or smoked fish. Whole eggs or egg yolks. Chicken or turkey with skin. Dairy Whole or 2% milk, cream, and half-and-half. Whole or full-fat cream cheese. Whole-fat or sweetened yogurt. Full-fat cheese. Nondairy creamers. Whipped toppings.  Processed cheese and cheese spreads. Fats and oils Butter. Stick margarine. Lard. Shortening. Ghee. Bacon fat. Tropical oils, such as coconut, palm kernel, or palm oil. Seasoning and other foods Salted popcorn and pretzels. Onion salt, garlic salt, seasoned salt, table salt, and sea salt. Worcestershire sauce. Tartar sauce. Barbecue sauce. Teriyaki sauce. Soy sauce, including reduced-sodium. Steak sauce. Canned and packaged gravies. Fish sauce. Oyster sauce. Cocktail sauce. Horseradish that you find on the shelf. Ketchup. Mustard. Meat flavorings and tenderizers. Bouillon cubes. Hot sauce and Tabasco sauce. Premade or packaged marinades. Premade or packaged taco seasonings. Relishes. Regular salad dressings. Where to find more information:  National Heart, Lung, and Blood Institute: www.nhlbi.nih.gov  American Heart Association: www.heart.org Summary  The DASH eating plan is a healthy eating plan that has been shown to reduce high blood pressure (hypertension). It may also reduce your risk for type 2 diabetes, heart disease, and stroke.  With the DASH eating plan, you should limit salt (sodium) intake to 2,300 mg a day. If you have hypertension, you may need to reduce your sodium intake to 1,500 mg a day.  When on the DASH eating plan, aim to eat more fresh fruits and vegetables, whole grains, lean proteins, low-fat dairy, and heart-healthy fats.  Work with your health care provider or diet and nutrition specialist (dietitian) to adjust your eating plan to your individual   calorie needs. This information is not intended to replace advice given to you by your health care provider. Make sure you discuss any questions you have with your health care provider. Document Released: 07/26/2011 Document Revised: 07/30/2016 Document Reviewed: 07/30/2016 Elsevier Interactive Patient Education  2018 Elsevier Inc.  

## 2018-04-28 NOTE — Progress Notes (Signed)
  Subjective:     Patient ID: Emily Donaldson, female   DOB: 26-Jul-1966, 52 y.o.   MRN: [id number]  HPI Emily Donaldson is a 52 year old black female in for BP check.  Review of Systems +dry mouth and coughs at night, some. Reviewed past medical,surgical, social and family history. Reviewed medications and allergies.     Objective:   Physical Exam BP (!) 152/98 (BP Location: Left Arm, Patient Position: Sitting)   Pulse 71   Ht 5\' 4"  (1.626 m)   Wt 206 lb 6.4 oz (93.6 kg)   BMI 35.43 kg/m   Skin warm and dry. . Lungs: clear to ausculation bilaterally. Cardiovascular: regular rate and rhythm. Will stop lisinopril.HCT, and start norvasc. Has gained almost 8 lbs, watch salt and sweets, will give copy DASH diet.  Assessment:     1. Essential hypertension       Plan:     Meds ordered this encounter  Medications  . amLODipine (NORVASC) 10 MG tablet    Sig: Take 1 tablet (10 mg total) by mouth daily.    Dispense:  30 tablet    Refill:  3    Order Specific Question:   Supervising Provider    Answer:   Emily Donaldson [[id number]]  Review DASH diet F/U in 3 months for BP check

## 2018-08-11 ENCOUNTER — Ambulatory Visit: Payer: BLUE CROSS/BLUE SHIELD | Admitting: Adult Health

## 2018-11-27 ENCOUNTER — Other Ambulatory Visit: Payer: Self-pay | Admitting: Adult Health

## 2019-01-28 ENCOUNTER — Other Ambulatory Visit (HOSPITAL_COMMUNITY): Payer: Self-pay | Admitting: Adult Health

## 2019-01-28 DIAGNOSIS — Z1231 Encounter for screening mammogram for malignant neoplasm of breast: Secondary | ICD-10-CM

## 2019-02-16 ENCOUNTER — Other Ambulatory Visit: Payer: Self-pay

## 2019-02-16 ENCOUNTER — Ambulatory Visit (HOSPITAL_COMMUNITY)
Admission: RE | Admit: 2019-02-16 | Discharge: 2019-02-16 | Disposition: A | Discharge status: Home / Self Care | Payer: BC Managed Care – PPO | Source: Ambulatory Visit | Attending: Adult Health | Admitting: Adult Health

## 2019-02-16 DIAGNOSIS — Z1231 Encounter for screening mammogram for malignant neoplasm of breast: Secondary | ICD-10-CM | POA: Diagnosis not present

## 2019-03-27 ENCOUNTER — Other Ambulatory Visit: Payer: BLUE CROSS/BLUE SHIELD | Admitting: Adult Health

## 2019-05-16 ENCOUNTER — Other Ambulatory Visit: Payer: Self-pay | Admitting: Adult Health

## 2019-06-02 ENCOUNTER — Other Ambulatory Visit (HOSPITAL_COMMUNITY)
Admission: RE | Admit: 2019-06-02 | Discharge: 2019-06-02 | Disposition: A | Discharge status: Home / Self Care | Payer: BC Managed Care – PPO | Source: Ambulatory Visit | Attending: Adult Health | Admitting: Adult Health

## 2019-06-02 ENCOUNTER — Ambulatory Visit (INDEPENDENT_AMBULATORY_CARE_PROVIDER_SITE_OTHER): Payer: BC Managed Care – PPO | Admitting: Adult Health

## 2019-06-02 ENCOUNTER — Other Ambulatory Visit: Payer: Self-pay

## 2019-06-02 ENCOUNTER — Encounter: Payer: Self-pay | Admitting: Adult Health

## 2019-06-02 VITALS — BP 129/81 | HR 82 | Ht 64.5 in | Wt 214.0 lb

## 2019-06-02 DIAGNOSIS — Z113 Encounter for screening for infections with a predominantly sexual mode of transmission: Secondary | ICD-10-CM | POA: Diagnosis not present

## 2019-06-02 DIAGNOSIS — I10 Essential (primary) hypertension: Secondary | ICD-10-CM | POA: Diagnosis not present

## 2019-06-02 DIAGNOSIS — B9689 Other specified bacterial agents as the cause of diseases classified elsewhere: Secondary | ICD-10-CM | POA: Diagnosis not present

## 2019-06-02 DIAGNOSIS — Z01419 Encounter for gynecological examination (general) (routine) without abnormal findings: Secondary | ICD-10-CM | POA: Diagnosis not present

## 2019-06-02 DIAGNOSIS — N760 Acute vaginitis: Secondary | ICD-10-CM | POA: Insufficient documentation

## 2019-06-02 DIAGNOSIS — Z1212 Encounter for screening for malignant neoplasm of rectum: Secondary | ICD-10-CM

## 2019-06-02 DIAGNOSIS — Z1211 Encounter for screening for malignant neoplasm of colon: Secondary | ICD-10-CM | POA: Diagnosis not present

## 2019-06-02 LAB — HEMOCCULT GUIAC POC 1CARD (OFFICE): Fecal Occult Blood, POC: NEGATIVE

## 2019-06-02 MED ORDER — AMLODIPINE BESYLATE 10 MG PO TABS: ORAL_TABLET | ORAL | 4 refills | Status: DC

## 2019-06-02 NOTE — Progress Notes (Signed)
Patient ID: Emily Donaldson, female   DOB: [birth year], 53 y.o.   MRN: [id number] History of Present Illness: Amberli is a 53 year old black female, married, PM in for a well woman gyn exam, she had a normal pap with negative HPV 11-21-17. PCP is Dr Nevada Crane   Current Medications, Allergies, Past Medical History, Past Surgical History, Family History and Social History were reviewed in [facility] record.     Review of Systems: Patient denies any headaches, hearing loss, fatigue, blurred vision, shortness of breath, chest pain, abdominal pain, problems with bowel movements, urination, or intercourse. No joint pain or mood swings. +Hot flashes at times, noticed fishy odor in vagina the other week     Physical Exam:BP 129/81 (BP Location: Left Arm, Patient Position: Sitting, Cuff Size: Normal)   Pulse 82   Ht 5' 4.5" (1.638 m)   Wt 214 lb (97.1 kg)   LMP 12/03/2011   BMI 36.17 kg/m  General:  Well developed, well nourished, no acute distress Skin:  Warm and dry Neck:  Midline trachea, normal thyroid, good ROM, no lymphadenopathy Lungs; Clear to auscultation bilaterally Breast:  No dominant palpable mass, retraction, or nipple discharge Cardiovascular: Regular rate and rhythm Abdomen:  Soft, non tender, no hepatosplenomegaly Pelvic:  External genitalia is normal in appearance, no lesions.  The vagina is normal in appearance. Urethra has no lesions or masses. The cervix is bulbous.  Uterus is felt to be normal size, shape, and contour.  No adnexal masses or tenderness noted.Bladder is non tender, no masses felt.CV swab obtained. Rectal: Good sphincter tone, no polyps, or hemorrhoids felt.  Hemoccult negative. Extremities/musculoskeletal:  No swelling or varicosities noted, no clubbing or cyanosis Psych:  No mood changes, alert and cooperative,seems happy Fall risk is low PHQ 2 score is 0. Examination chaperoned by Rolena Infante LPN  Impression and Plan: 1.  Encounter for well woman exam with routine gynecological exam -physical in 1 year -pap in 2022 -mammogram yearly had 02/16/2019 -Check CBC,CMP,TSH and lipids  2. Screening for colorectal cancer -colonoscopy in future, had to reschedule due to COVID 19  3. Essential hypertension -continue Norvasc Meds ordered this encounter  Medications  . amLODipine (NORVASC) 10 MG tablet    Sig: TAKE 1 TABLET(10 MG) BY MOUTH DAILY    Dispense:  90 tablet    Refill:  4    Order Specific Question:   Supervising Provider    Answer:   Elonda Husky, LUTHER H [[id number]]    4. Screening examination for STD (sexually transmitted disease)  CV swab sent for GC/CHL,trich and BV

## 2019-06-03 LAB — COMPREHENSIVE METABOLIC PANEL
ALT: 11 IU/L (ref 0–32)
AST: 14 IU/L (ref 0–40)
Albumin/Globulin Ratio: 1.3 (ref 1.2–2.2)
Albumin: 4.5 g/dL (ref 3.8–4.9)
Alkaline Phosphatase: 56 IU/L (ref 39–117)
BUN/Creatinine Ratio: 14 (ref 9–23)
BUN: 11 mg/dL (ref 6–24)
Bilirubin Total: 0.3 mg/dL (ref 0.0–1.2)
CO2: 22 mmol/L (ref 20–29)
Calcium: 9.6 mg/dL (ref 8.7–10.2)
Chloride: 104 mmol/L (ref 96–106)
Creatinine, Ser: 0.78 mg/dL (ref 0.57–1.00)
GFR calc Af Amer: 100 mL/min/{1.73_m2} (ref >59)
GFR calc non Af Amer: 87 mL/min/{1.73_m2} (ref >59)
Globulin, Total: 3.6 g/dL (ref 1.5–4.5)
Glucose: 72 mg/dL (ref 65–99)
Potassium: 3.8 mmol/L (ref 3.5–5.2)
Sodium: 141 mmol/L (ref 134–144)
Total Protein: 8.1 g/dL (ref 6.0–8.5)

## 2019-06-03 LAB — LIPID PANEL
Chol/HDL Ratio: 4.1 ratio (ref 0.0–4.4)
Cholesterol, Total: 200 mg/dL — ABNORMAL HIGH (ref 100–199)
HDL: 49 mg/dL (ref >39)
LDL Chol Calc (NIH): 131 mg/dL — ABNORMAL HIGH (ref 0–99)
Triglycerides: 109 mg/dL (ref 0–149)
VLDL Cholesterol Cal: 20 mg/dL (ref 5–40)

## 2019-06-03 LAB — CBC
Hematocrit: 36.9 % (ref 34.0–46.6)
Hemoglobin: 12.2 g/dL (ref 11.1–15.9)
MCH: 28.8 pg (ref 26.6–33.0)
MCHC: 33.1 g/dL (ref 31.5–35.7)
MCV: 87 fL (ref 79–97)
Platelets: 229 10*3/uL (ref 150–450)
RBC: 4.24 x10E6/uL (ref 3.77–5.28)
RDW: 12.8 % (ref 11.7–15.4)
WBC: 4.1 10*3/uL (ref 3.4–10.8)

## 2019-06-03 LAB — TSH: TSH: 1.33 u[IU]/mL (ref 0.450–4.500)

## 2019-06-05 LAB — CERVICOVAGINAL ANCILLARY ONLY
Bacterial Vaginitis (gardnerella): POSITIVE — AB
Chlamydia: NEGATIVE
Comment: NEGATIVE
Comment: NEGATIVE
Comment: NEGATIVE
Comment: NORMAL
Neisseria Gonorrhea: NEGATIVE
Trichomonas: NEGATIVE

## 2019-06-08 ENCOUNTER — Other Ambulatory Visit: Payer: Self-pay | Admitting: Adult Health

## 2019-06-08 MED ORDER — METRONIDAZOLE 500 MG PO TABS: 500.0000 mg | ORAL_TABLET | Freq: Two times a day (BID) | ORAL | 0 refills | Status: DC

## 2019-06-08 NOTE — Progress Notes (Signed)
+  BV on CV swab, will rx flagyl  

## 2020-06-04 ENCOUNTER — Other Ambulatory Visit: Payer: Self-pay | Admitting: Adult Health

## 2020-06-09 IMAGING — MG DIGITAL SCREENING BILATERAL MAMMOGRAM WITH TOMO AND CAD
6 of 10 series · 6 of 30 positions shown · non-contrast
Comparison: Previous exam(s).

CLINICAL DATA: Screening.

EXAM:
DIGITAL SCREENING BILATERAL MAMMOGRAM WITH TOMO AND CAD

[L CC synth-2D (1 of 2)]
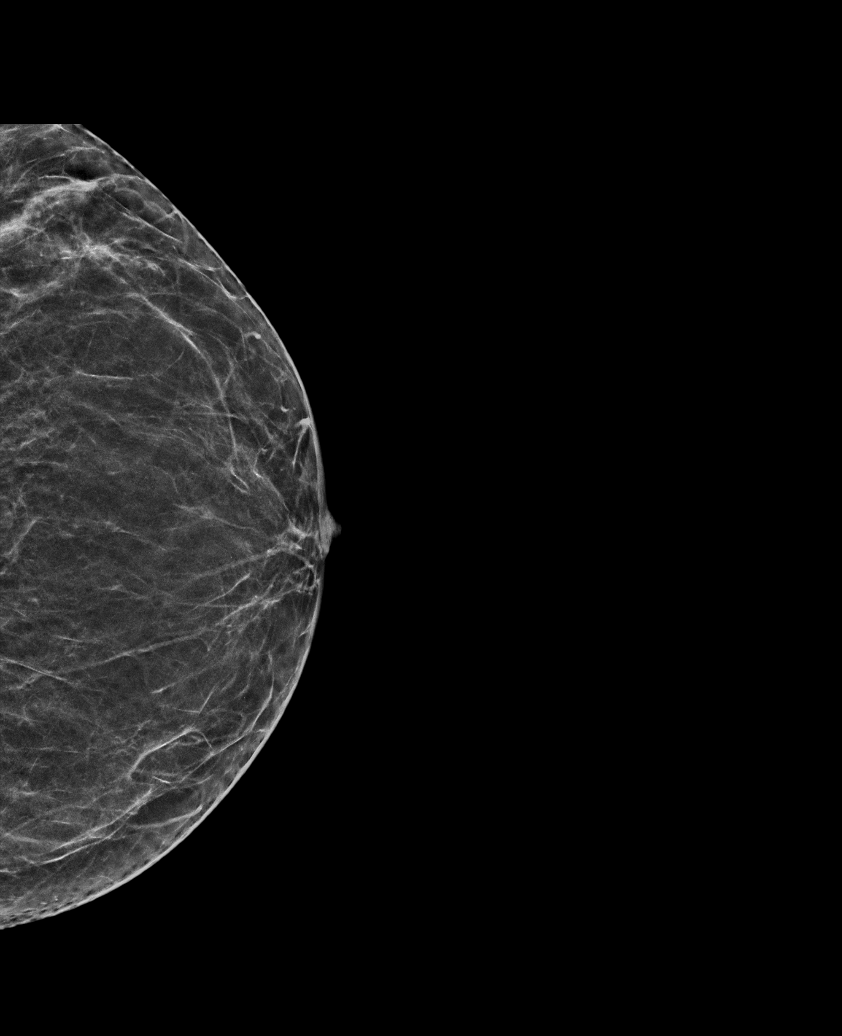

[L CC synth-2D (2 of 2)]
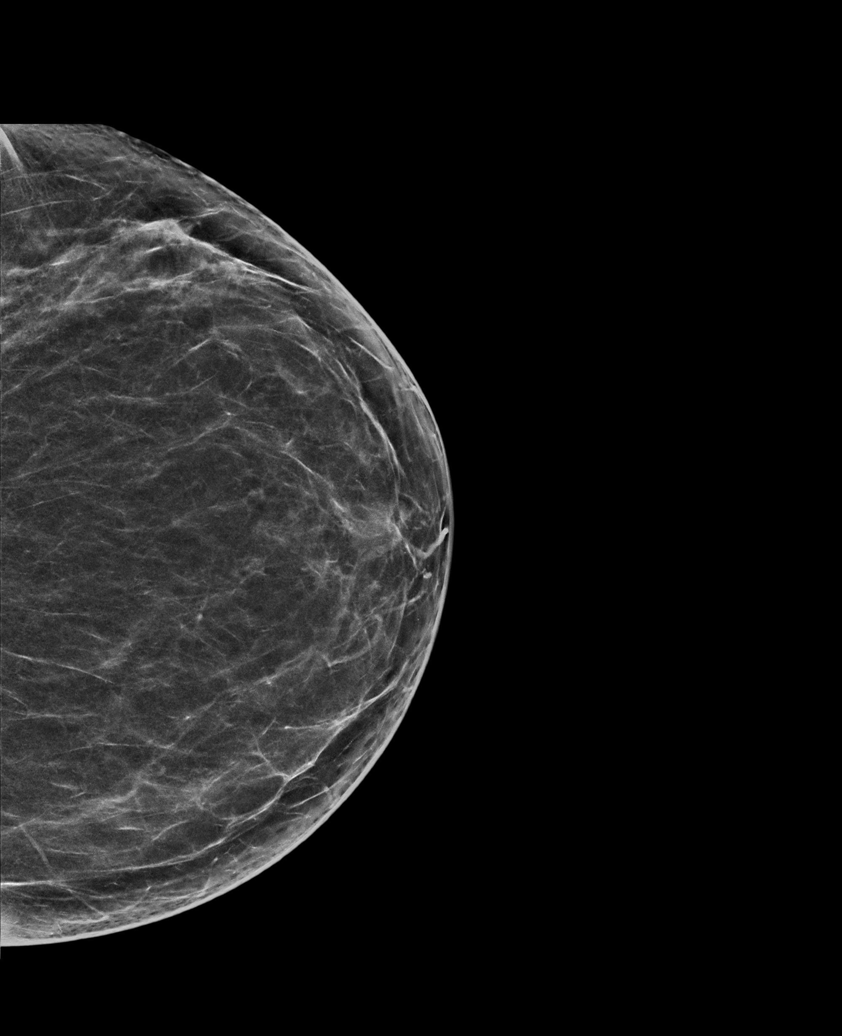

[R CC synth-2D]
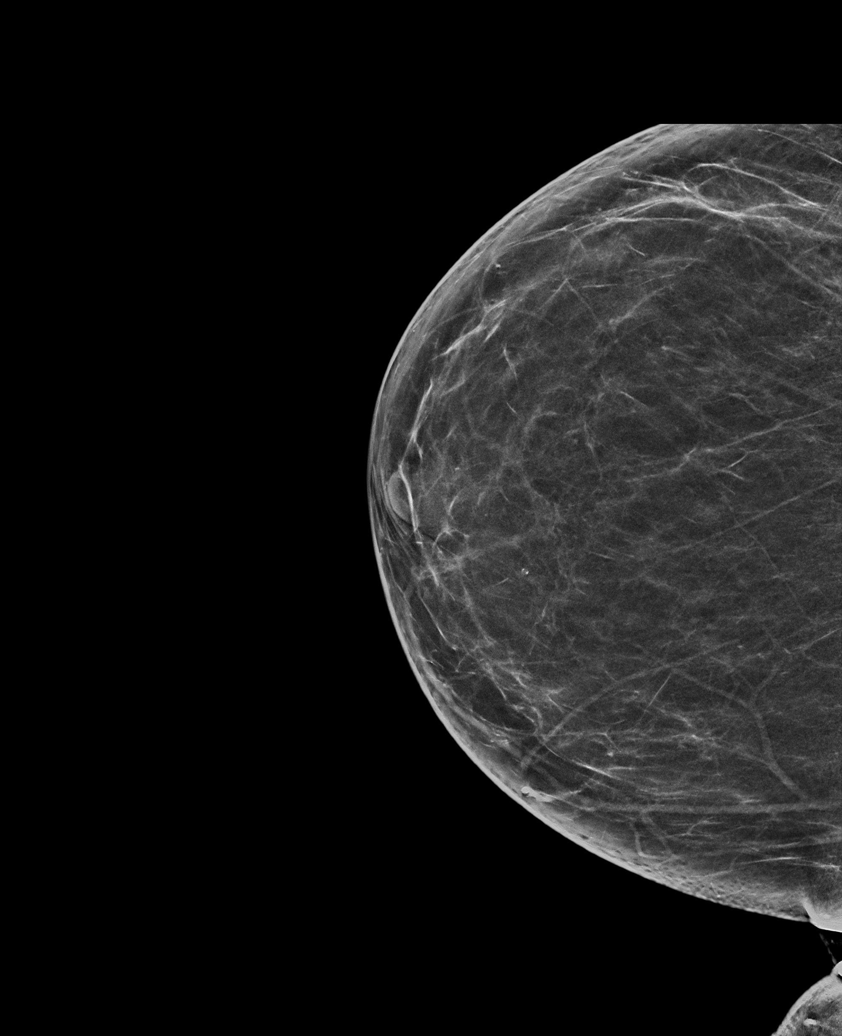

[R MLO synth-2D]
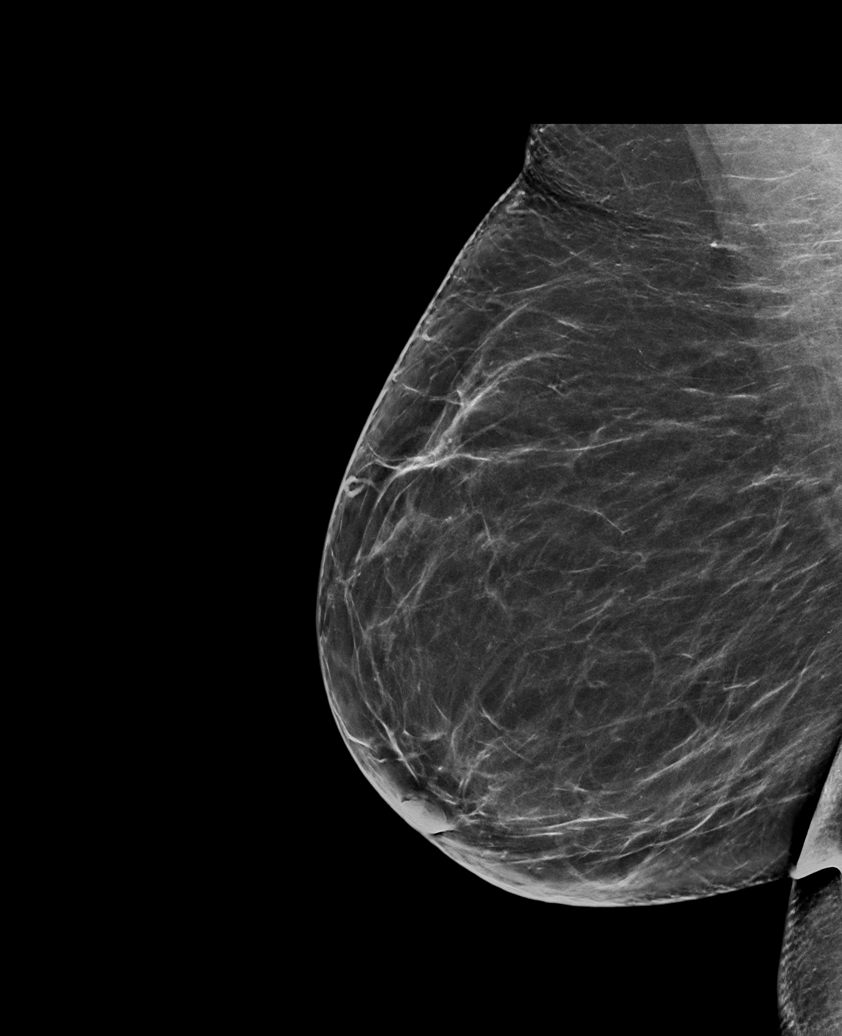

[L MLO synth-2D]
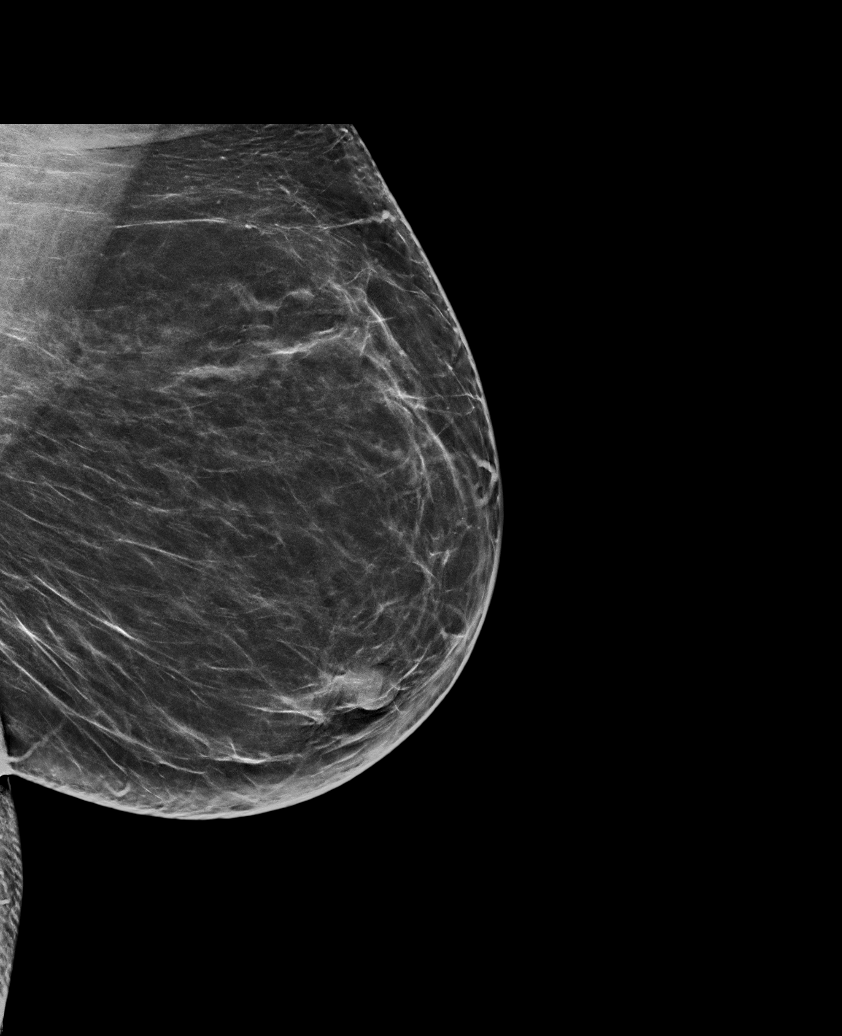

[R MLO tomo · tomo slice 39/76.0]
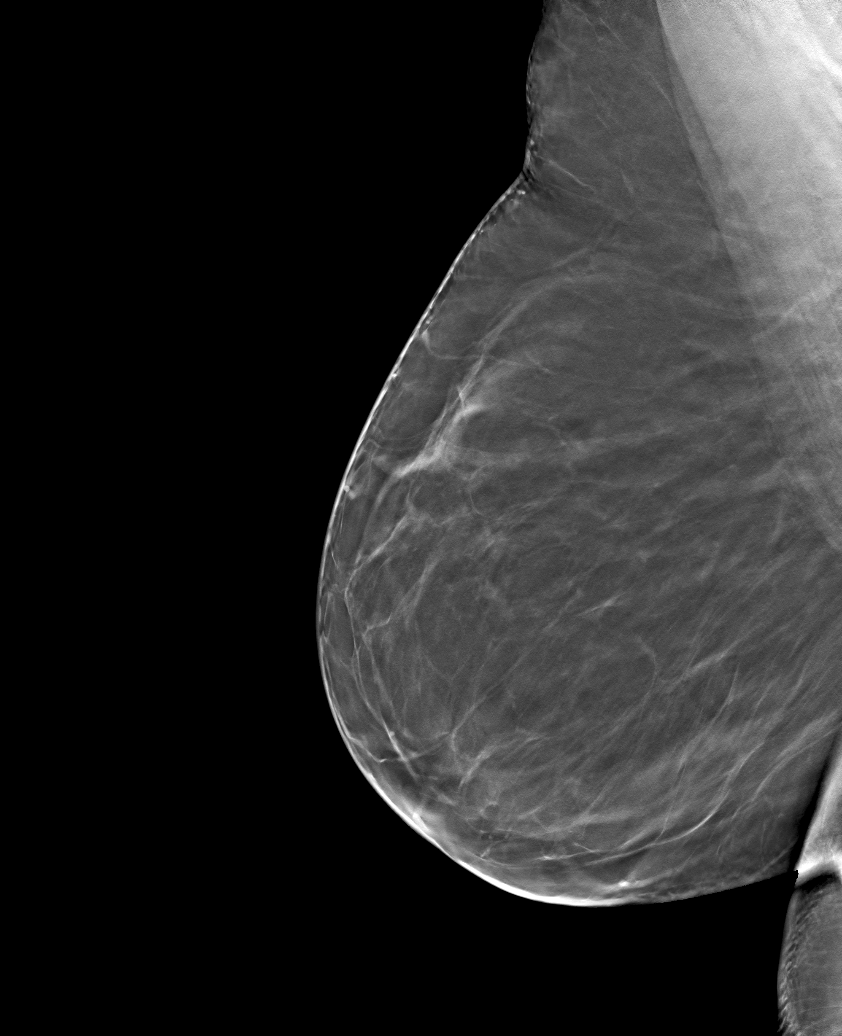

[6 of 30 positions shown; findings below may reference images not displayed]

ACR Breast Density Category b: There are scattered areas of
fibroglandular density.
FINDINGS: There are no findings suspicious for malignancy. Images were
processed with CAD.
IMPRESSION: No mammographic evidence of malignancy. A result letter of this
screening mammogram will be mailed directly to the patient.

RECOMMENDATION:
Screening mammogram in one year. (Code:[id number])

BI-RADS CATEGORY  1: Negative.

## 2020-10-11 ENCOUNTER — Encounter: Payer: Self-pay | Admitting: Emergency Medicine

## 2020-10-11 ENCOUNTER — Other Ambulatory Visit: Payer: Self-pay

## 2020-10-11 ENCOUNTER — Ambulatory Visit
Admission: EM | Admit: 2020-10-11 | Discharge: 2020-10-11 | Disposition: A | Discharge status: Home / Self Care | Payer: BC Managed Care – PPO | Attending: Emergency Medicine | Admitting: Emergency Medicine

## 2020-10-11 DIAGNOSIS — M5442 Lumbago with sciatica, left side: Secondary | ICD-10-CM | POA: Diagnosis not present

## 2020-10-11 DIAGNOSIS — M5441 Lumbago with sciatica, right side: Secondary | ICD-10-CM

## 2020-10-11 LAB — POCT URINALYSIS DIP (MANUAL ENTRY)
Bilirubin, UA: NEGATIVE
Blood, UA: NEGATIVE
Glucose, UA: NEGATIVE mg/dL
Ketones, POC UA: NEGATIVE mg/dL
Leukocytes, UA: NEGATIVE
Nitrite, UA: NEGATIVE
Protein Ur, POC: NEGATIVE mg/dL
Spec Grav, UA: >=1.03 — AB (ref 1.010–1.025)
Urobilinogen, UA: 1 E.U./dL
pH, UA: 6.5 (ref 5.0–8.0)

## 2020-10-11 MED ORDER — PREDNISONE 10 MG PO TABS: 20.0000 mg | ORAL_TABLET | Freq: Every day | ORAL | 0 refills | Status: DC

## 2020-10-11 MED ORDER — CYCLOBENZAPRINE HCL 10 MG PO TABS: 10.0000 mg | ORAL_TABLET | Freq: Every day | ORAL | 0 refills | Status: DC

## 2020-10-11 MED ORDER — DEXAMETHASONE SODIUM PHOSPHATE 10 MG/ML IJ SOLN
10.0000 mg | Freq: Once | INTRAMUSCULAR | Status: AC
  Administered 2020-10-11: 10 mg via INTRAMUSCULAR

## 2020-10-11 NOTE — ED Triage Notes (Signed)
Low back pain that radiates into legs since Saturday.  Denies any injury.  Denies urinary s/s

## 2020-10-11 NOTE — ED Provider Notes (Signed)
[facility]   [id number] 10/11/20 Arrival Time: 1554   Chief Complaint  Patient presents with  . Back Pain    SUBJECTIVE: History from: patient.  Emily Donaldson is a 55 y.o. female who presented to the urgent care for complaint of low back pain that radiates to bilateral leg started this past Saturday.  Denies precipitating event.  Localized pain to the lower mid back and legs.  She describes the pain as constant and achy.  She has tried OTC medications without relief.  Her symptoms are made worse with ROM.  She denies similar symptoms in the past.  Denies chills, fever, nausea, vomiting, diarrhea   ROS: As per HPI.  All other pertinent ROS negative.      Past Medical History:  Diagnosis Date  . Hypertension    Past Surgical History:  Procedure Laterality Date  . DENTAL SURGERY    . TUBAL LIGATION     No Known Allergies No current facility-administered medications on file prior to encounter.   Current Outpatient Medications on File Prior to Encounter  Medication Sig Dispense Refill  . amLODipine (NORVASC) 10 MG tablet TAKE 1 TABLET(10 MG) BY MOUTH DAILY 90 tablet 4  . metroNIDAZOLE (FLAGYL) 500 MG tablet Take 1 tablet (500 mg total) by mouth 2 (two) times daily. 14 tablet 0   Social History   Socioeconomic History  . Marital status: Married    Spouse name: Not on file  . Number of children: Not on file  . Years of education: Not on file  . Highest education level: Not on file  Occupational History  . Not on file  Tobacco Use  . Smoking status: Never Smoker  . Smokeless tobacco: Never Used  Vaping Use  . Vaping Use: Never used  Substance and Sexual Activity  . Alcohol use: Never  . Drug use: Never  . Sexual activity: Yes    Birth control/protection: Surgical    Comment: tubal  Other Topics Concern  . Not on file  Social History Narrative  . Not on file   Social Determinants of Health   Financial Resource Strain: Not on file  Food  Insecurity: Not on file  Transportation Needs: Not on file  Physical Activity: Not on file  Stress: Not on file  Social Connections: Not on file  Intimate Partner Violence: Not on file   Family History  Problem Relation Age of Onset  . Other Father        gunshot  . Breast cancer Mother   . Heart attack Mother   . Diabetes Sister     OBJECTIVE:  Vitals:   10/11/20 1601  BP: (!) 160/93  Pulse: 83  Resp: 18  Temp: 98.6 F (37 C)  TempSrc: Oral  SpO2: 98%     Physical Exam Vitals and nursing note reviewed.  Constitutional:      General: She is not in acute distress.    Appearance: Normal appearance. She is normal weight. She is not ill-appearing, toxic-appearing or diaphoretic.  HENT:     Head: Normocephalic.  Cardiovascular:     Rate and Rhythm: Normal rate and regular rhythm.     Pulses: Normal pulses.     Heart sounds: Normal heart sounds. No murmur heard. No friction rub. No gallop.   Pulmonary:     Effort: Pulmonary effort is normal. No respiratory distress.     Breath sounds: Normal breath sounds. No stridor. No wheezing, rhonchi or rales.  Chest:  Chest wall: No tenderness.  Musculoskeletal:        General: Tenderness present.     Lumbar back: Spasms and tenderness present.     Comments: Back:  Patient ambulates from chair to exam table without difficulty.  Inspection: Skin clear and intact without obvious swelling, erythema, or ecchymosis. Warm to the touch  Palpation: Vertebral processes nontender. Tenderness about the lower paravertebral muscles   Neurological:     Mental Status: She is alert and oriented to person, place, and time.      LABS:  Results for orders placed or performed during the hospital encounter of 10/11/20 (from the past 24 hour(s))  POCT urinalysis dipstick     Status: Abnormal   Collection Time: 10/11/20  4:13 PM  Result Value Ref Range   Color, UA yellow yellow   Clarity, UA clear clear   Glucose, UA negative negative  mg/dL   Bilirubin, UA negative negative   Ketones, POC UA negative negative mg/dL   Spec Grav, UA >=3.382 (A) 1.010 - 1.025   Blood, UA negative negative   pH, UA 6.5 5.0 - 8.0   Protein Ur, POC negative negative mg/dL   Urobilinogen, UA 1.0 0.2 or 1.0 E.U./dL   Nitrite, UA Negative Negative   Leukocytes, UA Negative Negative     ASSESSMENT & PLAN:  1. Acute midline low back pain with bilateral sciatica     Meds ordered this encounter  Medications  . dexamethasone (DECADRON) injection 10 mg  . predniSONE (DELTASONE) 10 MG tablet    Sig: Take 2 tablets (20 mg total) by mouth daily.    Dispense:  15 tablet    Refill:  0  . cyclobenzaprine (FLEXERIL) 10 MG tablet    Sig: Take 1 tablet (10 mg total) by mouth at bedtime.    Dispense:  20 tablet    Refill:  0    Discharge instructions  Rest, ice and heat as needed Ensure adequate ROM as tolerated. Continue to take OTC Tylenol/ibuprofen as needed for pain Prescribed prednisone Prescribed flexeril  for muscle spasm.  Do not drive or operate heavy machinery while taking this medication Return here or go to ER if you have any new or worsening symptoms such as numbness/tingling of the inner thighs, loss of bladder or bowel control, headache/blurry vision, nausea/vomiting, confusion/altered mental status, dizziness, weakness, passing out, imbalance, etc...   Reviewed expectations re: course of current medical issues. Questions answered. Outlined signs and symptoms indicating need for more acute intervention. Patient verbalized understanding. After Visit Summary given.         Durward Parcel, FNP 10/11/20 1644

## 2020-10-11 NOTE — Discharge Instructions (Addendum)
Rest, ice and heat as needed °Ensure adequate ROM as tolerated. °Continue to take OTC Tylenol/ibuprofen as needed for pain °Prescribed prednisone °Prescribed flexeril  for muscle spasm.  Do not drive or operate heavy machinery while taking this medication °Return here or go to ER if you have any new or worsening symptoms such as numbness/tingling of the inner thighs, loss of bladder or bowel control, headache/blurry vision, nausea/vomiting, confusion/altered mental status, dizziness, weakness, passing out, imbalance, etc...  °

## 2021-08-02 DIAGNOSIS — M25561 Pain in right knee: Secondary | ICD-10-CM | POA: Diagnosis not present

## 2021-08-02 DIAGNOSIS — M1711 Unilateral primary osteoarthritis, right knee: Secondary | ICD-10-CM | POA: Diagnosis not present

## 2021-08-10 ENCOUNTER — Ambulatory Visit (INDEPENDENT_AMBULATORY_CARE_PROVIDER_SITE_OTHER): Payer: BC Managed Care – PPO | Admitting: Orthopaedic Surgery

## 2021-08-10 ENCOUNTER — Other Ambulatory Visit: Payer: Self-pay

## 2021-08-10 ENCOUNTER — Encounter: Payer: Self-pay | Admitting: Orthopaedic Surgery

## 2021-08-10 VITALS — BP 159/86 | HR 79 | Ht 64.0 in | Wt 219.0 lb

## 2021-08-10 DIAGNOSIS — M25561 Pain in right knee: Secondary | ICD-10-CM | POA: Diagnosis not present

## 2021-08-10 DIAGNOSIS — G8929 Other chronic pain: Secondary | ICD-10-CM | POA: Diagnosis not present

## 2021-08-10 NOTE — Progress Notes (Signed)
Subjective:    Patient ID: Emily Donaldson, female    DOB: [birth year], 55 y.o.   MRN: [id number]  HPI She has had knee pain on the right on and off for several months getting worse last weekend.  She went to [facility] in [place] on 08-02-21.  X-rays were done and were negative.  I have reviewed the notes. She was given prednisone dose pack, voltaren gel and pain medicine.  She is better but still has swelling, popping and tendency to give way.  She has medial pain. She has no new trauma, no redness, no numbness.   Review of Systems  Constitutional:  Positive for activity change.  Musculoskeletal:  Positive for arthralgias, gait problem and joint swelling.  All other systems reviewed and are negative. For Review of Systems, all other systems reviewed and are negative.  The following is a summary of the past history medically, past history surgically, known current medicines, social history and family history.  This information is gathered electronically by the computer from prior information and documentation.  I review this each visit and have found including this information at this point in the chart is beneficial and informative.   Past Medical History:  Diagnosis Date   Hypertension     Past Surgical History:  Procedure Laterality Date   DENTAL SURGERY     TUBAL LIGATION      Current Outpatient Medications on File Prior to Visit  Medication Sig Dispense Refill   amLODipine (NORVASC) 10 MG tablet TAKE 1 TABLET(10 MG) BY MOUTH DAILY 90 tablet 4   cyclobenzaprine (FLEXERIL) 10 MG tablet Take 1 tablet (10 mg total) by mouth at bedtime. 20 tablet 0   diclofenac Sodium (VOLTAREN) 1 % GEL Apply 2 g topically 2 (two) times daily.     HYDROcodone-acetaminophen (NORCO/VICODIN) 5-325 MG tablet Take 1 tablet by mouth every 4 (four) hours as needed.     methylPREDNISolone (MEDROL DOSEPAK) 4 MG TBPK tablet See admin instructions. follow package directions     metroNIDAZOLE (FLAGYL) 500 MG  tablet Take 1 tablet (500 mg total) by mouth 2 (two) times daily. 14 tablet 0   No current facility-administered medications on file prior to visit.    Social History   Socioeconomic History   Marital status: Married    Spouse name: Not on file   Number of children: Not on file   Years of education: Not on file   Highest education level: Not on file  Occupational History   Not on file  Tobacco Use   Smoking status: Never   Smokeless tobacco: Never  Vaping Use   Vaping Use: Never used  Substance and Sexual Activity   Alcohol use: Never   Drug use: Never   Sexual activity: Yes    Birth control/protection: Surgical    Comment: tubal  Other Topics Concern   Not on file  Social History Narrative   Not on file   Social Determinants of Health   Financial Resource Strain: Not on file  Food Insecurity: Not on file  Transportation Needs: Not on file  Physical Activity: Not on file  Stress: Not on file  Social Connections: Not on file  Intimate Partner Violence: Not on file    Family History  Problem Relation Age of Onset   Other Father        gunshot   Breast cancer Mother    Heart attack Mother    Diabetes Sister     BP (!) 159/86  Pulse 79    Ht 5\' 4"  (1.626 m)    Wt 219 lb (99.3 kg)    LMP 12/03/2011    BMI 37.59 kg/m   Body mass index is 37.59 kg/m.     Objective:   Physical Exam Vitals and nursing note reviewed. Exam conducted with a chaperone present.  Constitutional:      Appearance: She is well-developed.  HENT:     Head: Normocephalic and atraumatic.  Eyes:     Conjunctiva/sclera: Conjunctivae normal.     Pupils: Pupils are equal, round, and reactive to light.  Cardiovascular:     Rate and Rhythm: Normal rate and regular rhythm.  Pulmonary:     Effort: Pulmonary effort is normal.  Abdominal:     Palpations: Abdomen is soft.  Musculoskeletal:     Cervical back: Normal range of motion and neck supple.       Legs:  Skin:    General: Skin is  warm and dry.  Neurological:     Mental Status: She is alert and oriented to person, place, and time.     Cranial Nerves: No cranial nerve deficit.     Motor: No abnormal muscle tone.     Coordination: Coordination normal.     Deep Tendon Reflexes: Reflexes are normal and symmetric. Reflexes normal.  Psychiatric:        Behavior: Behavior normal.        Thought Content: Thought content normal.        Judgment: Judgment normal.          Assessment & Plan:   Encounter Diagnosis  Name Primary?   Chronic pain of right knee Yes   PROCEDURE NOTE:  The patient requests injections of the right knee , verbal consent was obtained.  The right knee was prepped appropriately after time out was performed.   Sterile technique was observed and injection of 1 cc of DepoMedrol 40mg  with several cc's of plain xylocaine. Anesthesia was provided by ethyl chloride and a 20-gauge needle was used to inject the knee area. The injection was tolerated well.  A band aid dressing was applied.  The patient was advised to apply ice later today and tomorrow to the injection sight as needed.   Continue the Voltaren Gel.  Use Advil or Aleve.  Return in three weeks.  She may need MRI.  I am concerned about medial meniscus tear of the right knee.  Call if any problem.  Precautions discussed.  Electronically Signed 12/05/2011, MD 12/22/20228:40 AM

## 2021-08-30 ENCOUNTER — Other Ambulatory Visit: Payer: Self-pay | Admitting: Adult Health

## 2021-09-05 ENCOUNTER — Ambulatory Visit: Payer: BC Managed Care – PPO | Admitting: Orthopaedic Surgery

## 2021-11-29 ENCOUNTER — Other Ambulatory Visit: Payer: Self-pay | Admitting: Adult Health

## 2022-09-11 ENCOUNTER — Ambulatory Visit
Admission: EM | Admit: 2022-09-11 | Discharge: 2022-09-11 | Disposition: A | Discharge status: Home / Self Care | Payer: BC Managed Care – PPO | Attending: Family Medicine | Admitting: Family Medicine

## 2022-09-11 DIAGNOSIS — B9789 Other viral agents as the cause of diseases classified elsewhere: Secondary | ICD-10-CM

## 2022-09-11 DIAGNOSIS — J329 Chronic sinusitis, unspecified: Secondary | ICD-10-CM

## 2022-09-11 MED ORDER — PREDNISONE 20 MG PO TABS: 40.0000 mg | ORAL_TABLET | Freq: Every day | ORAL | 0 refills | Status: DC

## 2022-09-11 MED ORDER — FLUTICASONE PROPIONATE 50 MCG/ACT NA SUSP: 1.0000 | Freq: Two times a day (BID) | NASAL | 2 refills | Status: DC

## 2022-09-11 NOTE — Discharge Instructions (Signed)
Use saline sinus rinses several times daily as needed, Coricidin HBP as needed

## 2022-09-11 NOTE — ED Triage Notes (Signed)
Pt reports she has a headaches, nasal congestion, and right ear discomfort x 5 days. Took mucinex

## 2022-09-11 NOTE — ED Provider Notes (Signed)
[facility]    CSN: [id number] Arrival date & time: 09/11/22  1525      History   Chief Complaint No chief complaint on file.   HPI Emily Donaldson is a 57 y.o. female.   Presenting today with 5-day history of sinus headaches, nasal congestion, bilateral sinus pain and pressure, right ear pressure and popping.  Denies fever, chills, body aches, significant cough, abdominal pain, nausea vomiting or diarrhea.  So far tried Mucinex with no relief.  No known pertinent chronic medical problems per patient.    Past Medical History:  Diagnosis Date   Hypertension     Patient Active Problem List   Diagnosis Date Noted   Encounter for well woman exam with routine gynecological exam 06/02/2019   Essential hypertension 06/02/2019   Screening examination for STD (sexually transmitted disease) 06/02/2019   Screening for colorectal cancer 11/21/2017   Encounter for gynecological examination with Papanicolaou smear of cervix 11/21/2017   Menopausal symptoms 11/21/2017   Moody 11/21/2017   Hot flashes 11/21/2017   Elevated BP without diagnosis of hypertension 11/21/2017    Past Surgical History:  Procedure Laterality Date   DENTAL SURGERY     TUBAL LIGATION      OB History     Gravida  4   Para  4   Term  4   Preterm      AB      Living  4      SAB      IAB      Ectopic      Multiple      Live Births  4            Home Medications    Prior to Admission medications   Medication Sig Start Date End Date Taking? Authorizing Provider  fluticasone (FLONASE) 50 MCG/ACT nasal spray Place 1 spray into both nostrils 2 (two) times daily. 09/11/22  Yes Volney American, PA-C  predniSONE (DELTASONE) 20 MG tablet Take 2 tablets (40 mg total) by mouth daily with breakfast. 09/11/22  Yes Volney American, PA-C  amLODipine (NORVASC) 10 MG tablet TAKE 1 TABLET(10 MG) BY MOUTH DAILY 11/29/21   Estill Dooms, NP  cyclobenzaprine (FLEXERIL)  10 MG tablet Take 1 tablet (10 mg total) by mouth at bedtime. 10/11/20   Avegno, Darrelyn Hillock, FNP  diclofenac Sodium (VOLTAREN) 1 % GEL Apply 2 g topically 2 (two) times daily. 08/03/21   [provider]  HYDROcodone-acetaminophen (NORCO/VICODIN) 5-325 MG tablet Take 1 tablet by mouth every 4 (four) hours as needed. 08/03/21   [provider]  methylPREDNISolone (MEDROL DOSEPAK) 4 MG TBPK tablet See admin instructions. follow package directions 08/03/21   [provider]  metroNIDAZOLE (FLAGYL) 500 MG tablet Take 1 tablet (500 mg total) by mouth 2 (two) times daily. 06/08/19   Estill Dooms, NP    Family History Family History  Problem Relation Age of Onset   Other Father        gunshot   Breast cancer Mother    Heart attack Mother    Diabetes Sister     Social History Social History   Tobacco Use   Smoking status: Never   Smokeless tobacco: Never  Vaping Use   Vaping Use: Never used  Substance Use Topics   Alcohol use: Never   Drug use: Never     Allergies   Patient has no known allergies.   Review of Systems Review of Systems  PER HPI  Physical Exam Triage Vital Signs ED Triage Vitals  Enc Vitals Group     BP 09/11/22 1549 (!) 165/100     Pulse Rate 09/11/22 1549 85     Resp 09/11/22 1549 18     Temp 09/11/22 1549 99.4 F (37.4 C)     Temp Source 09/11/22 1549 Oral     SpO2 09/11/22 1549 98 %     Weight --      Height --      Head Circumference --      Peak Flow --      Pain Score 09/11/22 1552 8     Pain Loc --      Pain Edu? --      Excl. in GC? --    No data found.  Updated Vital Signs BP (!) 165/100 (BP Location: Right Arm)   Pulse 85   Temp 99.4 F (37.4 C) (Oral)   Resp 18   LMP 12/03/2011   SpO2 98%   Visual Acuity Right Eye Distance:   Left Eye Distance:   Bilateral Distance:    Right Eye Near:   Left Eye Near:    Bilateral Near:     Physical Exam Vitals and nursing note reviewed.  Constitutional:       Appearance: Normal appearance.  HENT:     Head: Atraumatic.     Right Ear: Tympanic membrane and external ear normal.     Left Ear: Tympanic membrane and external ear normal.     Nose: Rhinorrhea present.     Mouth/Throat:     Mouth: Mucous membranes are moist.     Pharynx: Posterior oropharyngeal erythema present.  Eyes:     Extraocular Movements: Extraocular movements intact.     Conjunctiva/sclera: Conjunctivae normal.  Cardiovascular:     Rate and Rhythm: Normal rate and regular rhythm.     Heart sounds: Normal heart sounds.  Pulmonary:     Effort: Pulmonary effort is normal.     Breath sounds: Normal breath sounds. No wheezing.  Musculoskeletal:        General: Normal range of motion.     Cervical back: Normal range of motion and neck supple.  Skin:    General: Skin is warm and dry.  Neurological:     Mental Status: She is alert and oriented to person, place, and time.  Psychiatric:        Mood and Affect: Mood normal.        Thought Content: Thought content normal.      UC Treatments / Results  Labs (all labs ordered are listed, but only abnormal results are displayed) Labs Reviewed - No data to display  EKG   Radiology No results found.  Procedures Procedures (including critical care time)  Medications Ordered in UC Medications - No data to display  Initial Impression / Assessment and Plan / UC Course  I have reviewed the triage vital signs and the nursing notes.  Pertinent labs & imaging results that were available during my care of the patient were reviewed by me and considered in my medical decision making (see chart for details).     Hypertensive in triage, otherwise vital signs reassuring.  Suspect viral sinusitis.  Treat with course of prednisone, Flonase, sinus rinses, supportive over-the-counter medications and home care.  Return for worsening symptoms.  Declines viral testing today.  Final Clinical Impressions(s) / UC Diagnoses   Final  diagnoses:  Viral sinusitis  Discharge Instructions      Use saline sinus rinses several times daily as needed, Coricidin HBP as needed    ED Prescriptions     Medication Sig Dispense Auth. Provider   predniSONE (DELTASONE) 20 MG tablet Take 2 tablets (40 mg total) by mouth daily with breakfast. 10 tablet Volney American, PA-C   fluticasone [facility]) 50 MCG/ACT nasal spray Place 1 spray into both nostrils 2 (two) times daily. 16 g Volney American, [place]      PDMP not reviewed this encounter.   Volney American, [place] 09/11/22 1710

## 2022-10-18 ENCOUNTER — Encounter: Payer: Self-pay | Admitting: Radiology

## 2022-11-16 ENCOUNTER — Ambulatory Visit: Payer: BC Managed Care – PPO | Admitting: Family Medicine

## 2022-11-16 VITALS — BP 154/100 | HR 79 | Ht 64.0 in | Wt 216.6 lb

## 2022-11-16 DIAGNOSIS — Z1231 Encounter for screening mammogram for malignant neoplasm of breast: Secondary | ICD-10-CM

## 2022-11-16 DIAGNOSIS — E669 Obesity, unspecified: Secondary | ICD-10-CM | POA: Diagnosis not present

## 2022-11-16 DIAGNOSIS — Z0001 Encounter for general adult medical examination with abnormal findings: Secondary | ICD-10-CM | POA: Diagnosis not present

## 2022-11-16 DIAGNOSIS — Z1211 Encounter for screening for malignant neoplasm of colon: Secondary | ICD-10-CM

## 2022-11-16 DIAGNOSIS — R5383 Other fatigue: Secondary | ICD-10-CM

## 2022-11-16 DIAGNOSIS — Z13220 Encounter for screening for lipoid disorders: Secondary | ICD-10-CM | POA: Diagnosis not present

## 2022-11-16 DIAGNOSIS — I10 Essential (primary) hypertension: Secondary | ICD-10-CM

## 2022-11-16 DIAGNOSIS — Z0000 Encounter for general adult medical examination without abnormal findings: Secondary | ICD-10-CM | POA: Insufficient documentation

## 2022-11-16 MED ORDER — AMLODIPINE BESYLATE 10 MG PO TABS
10.0000 mg | ORAL_TABLET | Freq: Every day | ORAL | 3 refills | Status: AC
Start: 2022-11-16 — End: ?

## 2022-11-16 NOTE — Assessment & Plan Note (Signed)
We discussed all her preventative health care.  Declines COVID-19 vaccine.  Declines influenza vaccine.   Order for mammogram placed.  Referral to GI for colonoscopy placed.  Advised to see OB/GYN for Pap smear.  Screening labs today.  Refilled her amlodipine.

## 2022-11-16 NOTE — Progress Notes (Signed)
Subjective:  Patient ID: Emily Donaldson, female    DOB: 1965/09/05  Age: 57 y.o. MRN: [id number]  CC: Chief Complaint  Patient presents with   Establish Care    Needs refill on BP medication    HPI:  57 year old female with obesity, hypertension presents to establish care.  Patient states that she is in need of a physical.  Patient needs labs for screening purposes and for her paperwork for her employer.  She is well overdue for essentially all of her routine healthcare maintenance items.  Did not get a flu vaccine this year.  She is not planning on further COVID vaccines.  She has previously seen OB/GYN.  She is overdue for mammogram and Pap smear.  She would like mammogram to be ordered today.  Patient is overdue for shingles vaccination as well as tetanus.  Patient is overdue for colonoscopy but is amendable to referral.  Patient declines HIV and hepatitis C screening.  Patient has not been compliant regarding her blood pressure medication.  Needs refill.  Her blood pressure is uncontrolled.  She has not been taking her medication.  Patient Active Problem List   Diagnosis Date Noted   Annual physical exam 11/16/2022   Essential hypertension 06/02/2019   Menopausal symptoms 11/21/2017    Social Hx   Social History   Socioeconomic History   Marital status: Married    Spouse name: Not on file   Number of children: Not on file   Years of education: Not on file   Highest education level: Not on file  Occupational History   Not on file  Tobacco Use   Smoking status: Never   Smokeless tobacco: Never  Vaping Use   Vaping Use: Never used  Substance and Sexual Activity   Alcohol use: Never   Drug use: Never   Sexual activity: Yes    Birth control/protection: Surgical    Comment: tubal  Other Topics Concern   Not on file  Social History Narrative   Not on file   Social Determinants of Health   Financial Resource Strain: Not on file  Food Insecurity: Not on file   Transportation Needs: Not on file  Physical Activity: Not on file  Stress: Not on file  Social Connections: Not on file    Review of Systems  Constitutional:  Positive for fatigue.       Hot flashes.  Respiratory: Negative.    Cardiovascular: Negative.    Objective:  BP (!) 154/100   Pulse 79   Ht 5\' 4"  (1.626 m)   Wt 216 lb 9.6 oz (98.2 kg)   LMP 12/03/2011   SpO2 99%   BMI 37.18 kg/m      11/16/2022    9:31 AM 09/11/2022    3:49 PM 08/10/2021    8:27 AM  BP/Weight  Systolic BP 123456 [id number]  Diastolic BP 123XX123 123XX123 86  Wt. (Lbs) 216.6  219  BMI 37.18 kg/m2  37.59 kg/m2    Physical Exam Vitals and nursing note reviewed.  Constitutional:      General: She is not in acute distress.    Appearance: Normal appearance. She is obese.  HENT:     Head: Normocephalic and atraumatic.  Eyes:     General:        Right eye: No discharge.        Left eye: No discharge.     Conjunctiva/sclera: Conjunctivae normal.  Cardiovascular:     Rate and Rhythm: Normal  rate and regular rhythm.     Heart sounds: No murmur heard. Pulmonary:     Effort: Pulmonary effort is normal.     Breath sounds: Normal breath sounds. No wheezing or rales.  Neurological:     Mental Status: She is alert.  Psychiatric:        Mood and Affect: Mood normal.        Behavior: Behavior normal.     Lab Results  Component Value Date   WBC 4.1 06/02/2019   HGB 12.2 06/02/2019   HCT 36.9 06/02/2019   PLT 229 06/02/2019   GLUCOSE 72 06/02/2019   CHOL 200 (H) 06/02/2019   TRIG 109 06/02/2019   HDL 49 06/02/2019   LDLCALC 131 (H) 06/02/2019   ALT 11 06/02/2019   AST 14 06/02/2019   NA 141 06/02/2019   K 3.8 06/02/2019   CL 104 06/02/2019   CREATININE 0.78 06/02/2019   BUN 11 06/02/2019   CO2 22 06/02/2019   TSH 1.330 06/02/2019     Assessment & Plan:   Problem List Items Addressed This Visit       Cardiovascular and Mediastinum   Essential hypertension   Relevant Medications   amLODipine  (NORVASC) 10 MG tablet   Other Relevant Orders   CMP14+EGFR     Other   Annual physical exam - Primary    We discussed all her preventative health care.  Declines COVID-19 vaccine.  Declines influenza vaccine.   Order for mammogram placed.  Referral to GI for colonoscopy placed.  Advised to see OB/GYN for Pap smear.  Screening labs today.  Refilled her amlodipine.      Other Visit Diagnoses     Obesity (BMI 30-39.9)       Relevant Orders   Hemoglobin A1c   Fatigue, unspecified type       Relevant Orders   CBC   TSH   Screening, lipid       Relevant Orders   Lipid panel   Encounter for screening mammogram for malignant neoplasm of breast       Relevant Orders   MM 3D SCREENING MAMMOGRAM BILATERAL BREAST   Colon cancer screening       Relevant Orders   Ambulatory referral to Gastroenterology       Meds ordered this encounter  Medications   amLODipine (NORVASC) 10 MG tablet    Sig: Take 1 tablet (10 mg total) by mouth daily.    Dispense:  90 tablet    Refill:  3    Follow-up:  Return in about 6 months (around 05/19/2023) for HTN follow up.  [place]

## 2022-11-16 NOTE — Patient Instructions (Signed)
Labs today.  Call [phone] to schedule mammogram.   Referral has been placed for colonoscopy.  Follow up with [facility] for your Pap smears.  Follow up in 6 months.

## 2022-11-17 LAB — CBC
Hematocrit: 36.7 % (ref 34.0–46.6)
Hemoglobin: 12.1 g/dL (ref 11.1–15.9)
MCH: 28.9 pg (ref 26.6–33.0)
MCHC: 33 g/dL (ref 31.5–35.7)
MCV: 88 fL (ref 79–97)
Platelets: 212 10*3/uL (ref 150–450)
RBC: 4.19 x10E6/uL (ref 3.77–5.28)
RDW: 12.8 % (ref 11.7–15.4)
WBC: 4.6 10*3/uL (ref 3.4–10.8)

## 2022-11-17 LAB — CMP14+EGFR
ALT: 13 IU/L (ref 0–32)
AST: 18 IU/L (ref 0–40)
Albumin/Globulin Ratio: 1.5 (ref 1.2–2.2)
Albumin: 4.5 g/dL (ref 3.8–4.9)
Alkaline Phosphatase: 53 IU/L (ref 44–121)
BUN/Creatinine Ratio: 13 (ref 9–23)
BUN: 11 mg/dL (ref 6–24)
Bilirubin Total: 0.3 mg/dL (ref 0.0–1.2)
CO2: 24 mmol/L (ref 20–29)
Calcium: 9.7 mg/dL (ref 8.7–10.2)
Chloride: 104 mmol/L (ref 96–106)
Creatinine, Ser: 0.87 mg/dL (ref 0.57–1.00)
Globulin, Total: 3.1 g/dL (ref 1.5–4.5)
Glucose: 92 mg/dL (ref 70–99)
Potassium: 4.4 mmol/L (ref 3.5–5.2)
Sodium: 141 mmol/L (ref 134–144)
Total Protein: 7.6 g/dL (ref 6.0–8.5)
eGFR: 78 mL/min/{1.73_m2} (ref >59)

## 2022-11-17 LAB — LIPID PANEL
Chol/HDL Ratio: 4.4 ratio (ref 0.0–4.4)
Cholesterol, Total: 233 mg/dL — ABNORMAL HIGH (ref 100–199)
HDL: 53 mg/dL (ref >39)
LDL Chol Calc (NIH): 164 mg/dL — ABNORMAL HIGH (ref 0–99)
Triglycerides: 93 mg/dL (ref 0–149)
VLDL Cholesterol Cal: 16 mg/dL (ref 5–40)

## 2022-11-17 LAB — TSH: TSH: 1.14 u[IU]/mL (ref 0.450–4.500)

## 2022-11-17 LAB — HEMOGLOBIN A1C
Est. average glucose Bld gHb Est-mCnc: 111 mg/dL
Hgb A1c MFr Bld: 5.5 % (ref 4.8–5.6)

## 2022-11-19 ENCOUNTER — Encounter: Payer: Self-pay | Admitting: *Deleted

## 2022-11-21 ENCOUNTER — Ambulatory Visit (HOSPITAL_COMMUNITY)
Admission: RE | Admit: 2022-11-21 | Discharge: 2022-11-21 | Disposition: A | Discharge status: Home / Self Care | Payer: BC Managed Care – PPO | Source: Ambulatory Visit | Attending: Family Medicine | Admitting: Family Medicine

## 2022-11-21 DIAGNOSIS — Z1231 Encounter for screening mammogram for malignant neoplasm of breast: Secondary | ICD-10-CM | POA: Diagnosis not present

## 2022-11-22 ENCOUNTER — Other Ambulatory Visit: Payer: Self-pay | Admitting: Family Medicine

## 2022-11-22 MED ORDER — ROSUVASTATIN CALCIUM 10 MG PO TABS: 10.0000 mg | ORAL_TABLET | Freq: Every day | ORAL | 3 refills | Status: DC

## 2022-12-05 ENCOUNTER — Telehealth: Payer: Self-pay | Admitting: *Deleted

## 2022-12-05 NOTE — Telephone Encounter (Signed)
  Procedure: colonoscopy  Height: 5'5" Weight: 215 lb        Have you had a colonoscopy before?  No  Do you have family history of colon cancer?  No  Do you have a family history of polyps? No  Previous colonoscopy with polyps removed? No  Do you have a history colorectal cancer?   no  Are you diabetic?  no  Do you have a prosthetic or mechanical heart valve? no  Do you have a pacemaker/defibrillator?   no  Have you had endocarditis/atrial fibrillation?  no  Do you use supplemental oxygen/CPAP?  mp  Have you had joint replacement within the last 12 months?  no  Do you tend to be constipated or have to use laxatives?  no   Do you have history of alcohol use? If yes, how much and how often.  no  Do you have history or are you using drugs? If yes, what do are you  using?  no  Have you ever had a stroke/heart attack?  no  Have you ever had a heart or other vascular stent placed,?no  Do you take weight loss medication? no  female patients,: have you had a hysterectomy? no                              are you post menopausal?  yes                              do you still have your menstrual cycle? no    Date of last menstrual period? unsure  Do you take any blood-thinning medications such as: (Plavix, aspirin, Coumadin, Aggrenox, Brilinta, Xarelto, Eliquis, Pradaxa, Savaysa or Effient)? no  If yes we need the name, milligram, dosage and who is prescribing doctor:  no             Current Outpatient Medications  Medication Sig Dispense Refill   amLODipine (NORVASC) 10 MG tablet Take 1 tablet (10 mg total) by mouth daily. 90 tablet 3   rosuvastatin (CRESTOR) 10 MG tablet Take 1 tablet (10 mg total) by mouth daily. 90 tablet 3   No current facility-administered medications for this visit.    No Known Allergies

## 2022-12-07 ENCOUNTER — Other Ambulatory Visit: Payer: Self-pay | Admitting: Family Medicine

## 2022-12-07 NOTE — Telephone Encounter (Signed)
Unable to refill per protocol, last refill by another provider not at this practice.  Requested Prescriptions  Pending Prescriptions Disp Refills   fluticasone (FLONASE) 50 MCG/ACT nasal spray [Pharmacy Med Name: FLUTICASONE 50MCG NASAL SP (120) RX] 16 g 2    Sig: SHAKE LIQUID AND USE 1 SPRAY IN EACH NOSTRIL TWICE DAILY     There is no refill protocol information for this order      

## 2022-12-25 NOTE — Telephone Encounter (Signed)
Pt called back but was accidentally hung up on.  LMTRC

## 2022-12-25 NOTE — Telephone Encounter (Signed)
Appropriate. ASA 2 

## 2022-12-25 NOTE — Telephone Encounter (Signed)
LMTRC

## 2022-12-26 ENCOUNTER — Encounter: Payer: Self-pay | Admitting: *Deleted

## 2022-12-26 ENCOUNTER — Other Ambulatory Visit: Payer: Self-pay | Admitting: *Deleted

## 2022-12-26 MED ORDER — PEG 3350-KCL-NA BICARB-NACL 420 G PO SOLR: 4000.0000 mL | Freq: Once | ORAL | 0 refills | Status: AC

## 2022-12-26 NOTE — Telephone Encounter (Signed)
Pt has been scheduled for 01/15/23 with Dr.Carver. Instructions sent via MyChart and prep sent to the pharmacy

## 2022-12-28 NOTE — Telephone Encounter (Signed)
Referral completed

## 2022-12-31 ENCOUNTER — Encounter: Payer: Self-pay | Admitting: *Deleted

## 2023-01-11 ENCOUNTER — Encounter (HOSPITAL_COMMUNITY)
Admission: RE | Admit: 2023-01-11 | Discharge: 2023-01-11 | Disposition: A | Discharge status: Home / Self Care | Payer: BC Managed Care – PPO | Source: Ambulatory Visit | Attending: Internal Medicine | Admitting: Internal Medicine

## 2023-01-15 ENCOUNTER — Encounter (HOSPITAL_COMMUNITY)
Admission: RE | Disposition: A | Discharge status: Home / Self Care | Payer: Self-pay | Source: Home / Self Care | Attending: Internal Medicine

## 2023-01-15 ENCOUNTER — Encounter (HOSPITAL_COMMUNITY): Payer: Self-pay

## 2023-01-15 ENCOUNTER — Ambulatory Visit (HOSPITAL_COMMUNITY)
Admission: RE | Admit: 2023-01-15 | Discharge: 2023-01-15 | Disposition: A | Discharge status: Home / Self Care | Payer: BC Managed Care – PPO | Attending: Internal Medicine | Admitting: Internal Medicine

## 2023-01-15 ENCOUNTER — Ambulatory Visit (HOSPITAL_COMMUNITY): Payer: BC Managed Care – PPO | Admitting: Anesthesiology

## 2023-01-15 DIAGNOSIS — I10 Essential (primary) hypertension: Secondary | ICD-10-CM | POA: Insufficient documentation

## 2023-01-15 DIAGNOSIS — Z79899 Other long term (current) drug therapy: Secondary | ICD-10-CM | POA: Insufficient documentation

## 2023-01-15 DIAGNOSIS — D122 Benign neoplasm of ascending colon: Secondary | ICD-10-CM | POA: Insufficient documentation

## 2023-01-15 DIAGNOSIS — K6389 Other specified diseases of intestine: Secondary | ICD-10-CM | POA: Diagnosis not present

## 2023-01-15 DIAGNOSIS — K5730 Diverticulosis of large intestine without perforation or abscess without bleeding: Secondary | ICD-10-CM | POA: Diagnosis not present

## 2023-01-15 DIAGNOSIS — Z1211 Encounter for screening for malignant neoplasm of colon: Secondary | ICD-10-CM | POA: Diagnosis not present

## 2023-01-15 DIAGNOSIS — Z139 Encounter for screening, unspecified: Secondary | ICD-10-CM | POA: Diagnosis not present

## 2023-01-15 DIAGNOSIS — K529 Noninfective gastroenteritis and colitis, unspecified: Secondary | ICD-10-CM | POA: Diagnosis not present

## 2023-01-15 HISTORY — PX: POLYPECTOMY: SHX5525

## 2023-01-15 HISTORY — PX: BIOPSY: SHX5522

## 2023-01-15 HISTORY — PX: COLONOSCOPY WITH PROPOFOL: SHX5780

## 2023-01-15 SURGERY — COLONOSCOPY WITH PROPOFOL
Anesthesia: General

## 2023-01-15 MED ORDER — LIDOCAINE HCL (CARDIAC) PF 100 MG/5ML IV SOSY
PREFILLED_SYRINGE | INTRAVENOUS | Status: DC | PRN
  Administered 2023-01-15: 50 mg via INTRAVENOUS

## 2023-01-15 MED ORDER — DEXMEDETOMIDINE HCL IN NACL 80 MCG/20ML IV SOLN
INTRAVENOUS | Status: DC | PRN
  Administered 2023-01-15 (×2): 8 ug via INTRAVENOUS

## 2023-01-15 MED ORDER — PROPOFOL 10 MG/ML IV BOLUS
INTRAVENOUS | Status: DC | PRN
  Administered 2023-01-15: 50 mg via INTRAVENOUS
  Administered 2023-01-15: 120 mg via INTRAVENOUS
  Administered 2023-01-15 (×2): 50 mg via INTRAVENOUS

## 2023-01-15 MED ORDER — LACTATED RINGERS IV SOLN: INTRAVENOUS | Status: DC

## 2023-01-15 NOTE — Op Note (Addendum)
[facility] Patient Name: Emily Donaldson Procedure Date: 01/15/2023 1:42 PM MRN: [id number] Date of Birth: 07/08/1966 Attending MD: Hennie Duos. Marletta Lor , [place], [id number] CSN: [id number] Age: 57 Admit Type: Outpatient Procedure:                Colonoscopy Indications:              Screening for colorectal malignant neoplasm Providers:                Hennie Duos. Marletta Lor, DO, Crystal Page, Jannett Celestine,                            RN, Angelica Ran, Edrick Kins, RN Referring MD:              Medicines:                See the Anesthesia note for documentation of the                            administered medications Complications:            No immediate complications. Estimated Blood Loss:     Estimated blood loss was minimal. Procedure:                Pre-Anesthesia Assessment:                           - The anesthesia plan was to use monitored                            anesthesia care (MAC).                           After obtaining informed consent, the colonoscope                            was passed under direct vision. Throughout the                            procedure, the patient's blood pressure, pulse, and                            oxygen saturations were monitored continuously. The                            PCF-HQ190L ([id number]) scope was introduced through                            the anus and advanced to the the cecum, identified                            by appendiceal orifice and ileocecal valve. The                            colonoscopy was performed without difficulty. The                            patient  tolerated the procedure well. The quality                            of the bowel preparation was evaluated using the                            BBPS [facility] Bowel Preparation Scale) with scores                            of: Right Colon = 3, Transverse Colon = 3 and Left                            Colon = 3 (entire mucosa seen well with no residual                             staining, small fragments of stool or opaque                            liquid). The total BBPS score equals 9. Scope In: 1:52:24 PM Scope Out: 2:02:51 PM Scope Withdrawal Time: 0 hours 8 minutes 12 seconds  Total Procedure Duration: 0 hours 10 minutes 27 seconds  Findings:      Multiple medium-mouthed and small-mouthed diverticula were found in the       sigmoid colon.      A 5 mm polyp was found in the ascending colon. The polyp was sessile.       The polyp was removed with a cold snare. Resection and retrieval were       complete.      Localized ?mild inflammation vs mild melanosis coli was found in the       cecum. Biopsies were taken with a cold forceps for histology.      The exam was otherwise without abnormality. Impression:               - Diverticulosis in the sigmoid colon.                           - One 5 mm polyp in the ascending colon, removed                            with a cold snare. Resected and retrieved.                           - Localized mild inflammation was found in the                            cecum. Biopsied.                           - The examination was otherwise normal. Moderate Sedation:      Per Anesthesia Care Recommendation:           - Patient has a contact number available for                            emergencies.  The signs and symptoms of potential                            delayed complications were discussed with the                            patient. Return to normal activities tomorrow.                            Written discharge instructions were provided to the                            patient.                           - Resume previous diet.                           - Continue present medications.                           - Await pathology results.                           - Repeat colonoscopy in 7 years for surveillance.                           - Return to GI clinic PRN. Procedure Code(s):        ---  Professional ---                           [phone], Colonoscopy, flexible; with removal of                            tumor(s), polyp(s), or other lesion(s) by snare                            technique                           45380, 59, Colonoscopy, flexible; with biopsy,                            single or multiple Diagnosis Code(s):        --- Professional ---                           Z12.11, Encounter for screening for malignant                            neoplasm of colon                           D12.2, Benign neoplasm of ascending colon                           K52.9, Noninfective gastroenteritis and colitis,  unspecified                           K57.30, Diverticulosis of large intestine without                            perforation or abscess without bleeding CPT copyright 2022 American Medical Association. All rights reserved. The codes documented in this report are preliminary and upon coder review may  be revised to meet current compliance requirements. Hennie Duos. Marletta Lor, DO Hennie Duos. Marletta Lor, DO 01/15/2023 2:06:54 PM This report has been signed electronically. Number of Addenda: 0

## 2023-01-15 NOTE — Anesthesia Procedure Notes (Signed)
Date/Time: 01/15/2023 1:54 PM  Performed by: Julian Reil, CRNAPre-anesthesia Checklist: Patient identified, Emergency Drugs available, Suction available and Patient being monitored Patient Re-evaluated:Patient Re-evaluated prior to induction Oxygen Delivery Method: Nasal cannula Induction Type: IV induction Placement Confirmation: positive ETCO2

## 2023-01-15 NOTE — H&P (Signed)
Primary Care Physician:  Tommie Sams, DO Primary Gastroenterologist:  Dr. Marletta Lor  Pre-Procedure History & Physical: HPI:  Emily Donaldson is a 57 y.o. female is here for first ever colonoscopy for colon cancer screening purposes.  Patient denies any family history of colorectal cancer.  No melena or hematochezia.  No abdominal pain or unintentional weight loss.  No change in bowel habits.  Overall feels well from a GI standpoint.  Past Medical History:  Diagnosis Date   Hypertension     Past Surgical History:  Procedure Laterality Date   DENTAL SURGERY     TUBAL LIGATION      Prior to Admission medications   Medication Sig Start Date End Date Taking? Authorizing Provider  amLODipine (NORVASC) 10 MG tablet Take 1 tablet (10 mg total) by mouth daily. 11/16/22  Yes Cook, Jayce G, DO  rosuvastatin (CRESTOR) 10 MG tablet Take 1 tablet (10 mg total) by mouth daily. 11/22/22  Yes Tommie Sams, DO    Allergies as of 12/26/2022   (No Known Allergies)    Family History  Problem Relation Age of Onset   Other Father        gunshot   Breast cancer Mother    Heart attack Mother    Diabetes Sister     Social History   Socioeconomic History   Marital status: Married    Spouse name: Not on file   Number of children: Not on file   Years of education: Not on file   Highest education level: Not on file  Occupational History   Not on file  Tobacco Use   Smoking status: Never   Smokeless tobacco: Never  Vaping Use   Vaping Use: Never used  Substance and Sexual Activity   Alcohol use: Never   Drug use: Never   Sexual activity: Yes    Birth control/protection: Surgical    Comment: tubal  Other Topics Concern   Not on file  Social History Narrative   Not on file   Social Determinants of Health   Financial Resource Strain: Not on file  Food Insecurity: Not on file  Transportation Needs: Not on file  Physical Activity: Not on file  Stress: Not on file  Social Connections:  Not on file  Intimate Partner Violence: Not on file    Review of Systems: See HPI, otherwise negative ROS  Physical Exam: Vital signs in last 24 hours: Weight:  [95.3 kg] 95.3 kg (05/28 1239)   General:   Alert,  Well-developed, well-nourished, pleasant and cooperative in NAD Head:  Normocephalic and atraumatic. Eyes:  Sclera clear, no icterus.   Conjunctiva pink. Ears:  Normal auditory acuity. Nose:  No deformity, discharge,  or lesions. Msk:  Symmetrical without gross deformities. Normal posture. Extremities:  Without clubbing or edema. Neurologic:  Alert and  oriented x4;  grossly normal neurologically. Skin:  Intact without significant lesions or rashes. Psych:  Alert and cooperative. Normal mood and affect.  Impression/Plan: Emily Donaldson is here for a colonoscopy to be performed for colon cancer screening purposes.  The risks of the procedure including infection, bleed, or perforation as well as benefits, limitations, alternatives and imponderables have been reviewed with the patient. Questions have been answered. All parties agreeable.

## 2023-01-15 NOTE — Transfer of Care (Signed)
Immediate Anesthesia Transfer of Care Note  Patient: Emily Donaldson  Procedure(s) Performed: COLONOSCOPY WITH PROPOFOL BIOPSY POLYPECTOMY  Patient Location: Short Stay  Anesthesia Type:General  Level of Consciousness: awake  Airway & Oxygen Therapy: Patient Spontanous Breathing  Post-op Assessment: Report given to RN and Post -op Vital signs reviewed and stable  Post vital signs: Reviewed and stable  Last Vitals:  Vitals Value Taken Time  BP 90/55 01/15/23 1407  Temp 36.7 C 01/15/23 1407  Pulse 75 01/15/23 1407  Resp 14 01/15/23 1407  SpO2 99 % 01/15/23 1407    Last Pain:  Vitals:   01/15/23 1306  TempSrc: (P) Oral  PainSc: (P) 0-No pain      Patients Stated Pain Goal: (P) 5 (01/15/23 1306)  Complications: No notable events documented.

## 2023-01-15 NOTE — Anesthesia Preprocedure Evaluation (Signed)
Anesthesia Evaluation  Patient identified by MRN, date of birth, ID band Patient awake    Reviewed: Allergy & Precautions, H&P , NPO status , Patient's Chart, lab work & pertinent test results, reviewed documented beta blocker date and time   Airway Mallampati: II  TM Distance: >3 FB Neck ROM: full    Dental no notable dental hx.    Pulmonary neg pulmonary ROS   Pulmonary exam normal breath sounds clear to auscultation       Cardiovascular Exercise Tolerance: Good hypertension, negative cardio ROS  Rhythm:regular Rate:Normal     Neuro/Psych negative neurological ROS  negative psych ROS   GI/Hepatic negative GI ROS, Neg liver ROS,,,  Endo/Other  negative endocrine ROS    Renal/GU negative Renal ROS  negative genitourinary   Musculoskeletal   Abdominal   Peds  Hematology negative hematology ROS (+)   Anesthesia Other Findings   Reproductive/Obstetrics negative OB ROS                             Anesthesia Physical Anesthesia Plan  ASA: 2  Anesthesia Plan: General   Post-op Pain Management:    Induction:   PONV Risk Score and Plan:   Airway Management Planned:   Additional Equipment:   Intra-op Plan:   Post-operative Plan:   Informed Consent: I have reviewed the patients History and Physical, chart, labs and discussed the procedure including the risks, benefits and alternatives for the proposed anesthesia with the patient or authorized representative who has indicated his/her understanding and acceptance.     Dental Advisory Given  Plan Discussed with: CRNA  Anesthesia Plan Comments:        Anesthesia Quick Evaluation  

## 2023-01-17 LAB — SURGICAL PATHOLOGY

## 2023-01-22 ENCOUNTER — Encounter (HOSPITAL_COMMUNITY): Payer: Self-pay | Admitting: Internal Medicine

## 2023-01-23 NOTE — Anesthesia Postprocedure Evaluation (Signed)
Anesthesia Post Note  Patient: [name]  Procedure(s) Performed: COLONOSCOPY WITH PROPOFOL BIOPSY POLYPECTOMY  Patient location during evaluation: Phase II Anesthesia Type: General Level of consciousness: awake Pain management: pain level controlled Vital Signs Assessment: post-procedure vital signs reviewed and stable Respiratory status: spontaneous breathing and respiratory function stable Cardiovascular status: blood pressure returned to baseline and stable Postop Assessment: no headache and no apparent nausea or vomiting Anesthetic complications: no Comments: Late entry   No notable events documented.   Last Vitals:  Vitals:   01/15/23 1306 01/15/23 1407  BP: (P) 118/75 (!) 90/55  Pulse: (P) 77 75  Resp: (P) 18 14  Temp: (P) 36.7 C 36.7 C  SpO2: (P) 99% 99%    Last Pain:  Vitals:   01/15/23 1306  TempSrc: (P) Oral  PainSc: (P) 0-No pain                 Emily Donaldson

## 2023-05-21 ENCOUNTER — Ambulatory Visit: Payer: BC Managed Care – PPO | Admitting: Family Medicine

## 2023-05-22 ENCOUNTER — Ambulatory Visit: Payer: BC Managed Care – PPO | Admitting: Family Medicine

## 2023-05-22 VITALS — BP 122/82 | HR 83 | Ht 64.0 in | Wt 216.0 lb

## 2023-05-22 DIAGNOSIS — I10 Essential (primary) hypertension: Secondary | ICD-10-CM | POA: Diagnosis not present

## 2023-05-22 DIAGNOSIS — Z23 Encounter for immunization: Secondary | ICD-10-CM

## 2023-05-22 DIAGNOSIS — E785 Hyperlipidemia, unspecified: Secondary | ICD-10-CM | POA: Diagnosis not present

## 2023-05-22 DIAGNOSIS — Z0000 Encounter for general adult medical examination without abnormal findings: Secondary | ICD-10-CM | POA: Insufficient documentation

## 2023-05-22 MED ORDER — ROSUVASTATIN CALCIUM 10 MG PO TABS: 10.0000 mg | ORAL_TABLET | Freq: Every day | ORAL | 3 refills | Status: DC

## 2023-05-22 MED ORDER — AMLODIPINE BESYLATE 10 MG PO TABS: 10.0000 mg | ORAL_TABLET | Freq: Every day | ORAL | 3 refills | Status: DC

## 2023-05-22 NOTE — Progress Notes (Signed)
Subjective:  Patient ID: Emily Donaldson, female    DOB: Nov 25, 1965  Age: 57 y.o. MRN: [id number]  CC: Chief Complaint  Patient presents with   Hypertension   Hyperlipidemia    HPI:  57 year old female presents for follow-up.  Hypertension is well-controlled on amlodipine.  Needs reassessment of lipids due to see response to Crestor.  She is feeling well.  No chest pain or shortness of breath.  She has no complaints at this time.  Patient is in need of Pap smear.  Will discuss this today.  She desires her flu vaccine today.  Patient Active Problem List   Diagnosis Date Noted   Hyperlipidemia 05/22/2023   Preventative health care 05/22/2023   Essential hypertension 06/02/2019   Menopausal symptoms 11/21/2017    Social Hx   Social History   Socioeconomic History   Marital status: Married    Spouse name: Not on file   Number of children: Not on file   Years of education: Not on file   Highest education level: Not on file  Occupational History   Not on file  Tobacco Use   Smoking status: Never   Smokeless tobacco: Never  Vaping Use   Vaping status: Never Used  Substance and Sexual Activity   Alcohol use: Never   Drug use: Never   Sexual activity: Yes    Birth control/protection: Surgical    Comment: tubal  Other Topics Concern   Not on file  Social History Narrative   Not on file   Social Determinants of Health   Financial Resource Strain: Not on file  Food Insecurity: Not on file  Transportation Needs: Not on file  Physical Activity: Not on file  Stress: Not on file  Social Connections: Not on file    Review of Systems Per HPI  Objective:  BP 122/82   Pulse 83   Ht 5\' 4"  (1.626 m)   Wt 216 lb (98 kg)   LMP 12/03/2011   SpO2 100%   BMI 37.08 kg/m      05/22/2023    3:28 PM 01/15/2023    2:07 PM 01/15/2023    1:06 PM  BP/Weight  Systolic BP 122 90 118  Diastolic BP 82 55 75  Wt. (Lbs) 216    BMI 37.08 kg/m2      Physical  Exam Vitals and nursing note reviewed.  Constitutional:      General: She is not in acute distress.    Appearance: Normal appearance. She is obese.  HENT:     Head: Normocephalic and atraumatic.  Cardiovascular:     Rate and Rhythm: Normal rate and regular rhythm.  Pulmonary:     Effort: Pulmonary effort is normal.     Breath sounds: Normal breath sounds. No wheezing or rales.  Neurological:     Mental Status: She is alert.  Psychiatric:        Mood and Affect: Mood normal.        Behavior: Behavior normal.     Lab Results  Component Value Date   WBC 4.6 11/16/2022   HGB 12.1 11/16/2022   HCT 36.7 11/16/2022   PLT 212 11/16/2022   GLUCOSE 92 11/16/2022   CHOL 233 (H) 11/16/2022   TRIG 93 11/16/2022   HDL 53 11/16/2022   LDLCALC 164 (H) 11/16/2022   ALT 13 11/16/2022   AST 18 11/16/2022   NA 141 11/16/2022   K 4.4 11/16/2022   CL 104 11/16/2022   CREATININE 0.87  11/16/2022   BUN 11 11/16/2022   CO2 24 11/16/2022   TSH 1.140 11/16/2022   HGBA1C 5.5 11/16/2022     Assessment & Plan:   Problem List Items Addressed This Visit       Cardiovascular and Mediastinum   Essential hypertension - Primary    Well-controlled.  Continue amlodipine.      Relevant Medications   amLODipine (NORVASC) 10 MG tablet   rosuvastatin (CRESTOR) 10 MG tablet     Other   Hyperlipidemia    Lipid panel to assess response to Crestor.  Continue.      Relevant Medications   amLODipine (NORVASC) 10 MG tablet   rosuvastatin (CRESTOR) 10 MG tablet   Other Relevant Orders   Lipid panel   Preventative health care    Patient advised to make appointment for Pap smear.  She got her flu vaccine today.      Other Visit Diagnoses     Immunization due       Relevant Orders   Flu vaccine trivalent PF, 6mos and older(Flulaval,Afluria,Fluarix,Fluzone) (Completed)       Meds ordered this encounter  Medications   amLODipine (NORVASC) 10 MG tablet    Sig: Take 1 tablet (10 mg total)  by mouth daily.    Dispense:  90 tablet    Refill:  3   rosuvastatin (CRESTOR) 10 MG tablet    Sig: Take 1 tablet (10 mg total) by mouth daily.    Dispense:  90 tablet    Refill:  3    Follow-up:  6 months  Dilpreet Faires Adriana Simas DO [facility]

## 2023-05-22 NOTE — Assessment & Plan Note (Signed)
Lipid panel to assess response to Crestor.  Continue.

## 2023-05-22 NOTE — Patient Instructions (Signed)
Lab ordered.  Continue your medications.  Schedule pap smear.  Follow up in 6 months.

## 2023-05-22 NOTE — Assessment & Plan Note (Signed)
Patient advised to make appointment for Pap smear.  She got her flu vaccine today.

## 2023-05-22 NOTE — Assessment & Plan Note (Signed)
Well controlled. Continue amlodipine 

## 2023-05-23 LAB — LIPID PANEL
Chol/HDL Ratio: 2.7 {ratio} (ref 0.0–4.4)
Cholesterol, Total: 148 mg/dL (ref 100–199)
HDL: 55 mg/dL (ref >39)
LDL Chol Calc (NIH): 76 mg/dL (ref 0–99)
Triglycerides: 90 mg/dL (ref 0–149)
VLDL Cholesterol Cal: 17 mg/dL (ref 5–40)

## 2023-07-10 ENCOUNTER — Ambulatory Visit (INDEPENDENT_AMBULATORY_CARE_PROVIDER_SITE_OTHER): Payer: BC Managed Care – PPO | Admitting: Nurse Practitioner

## 2023-07-10 VITALS — BP 136/80 | HR 92 | Temp 97.7°F | Ht 64.0 in | Wt 219.0 lb

## 2023-07-10 DIAGNOSIS — Z124 Encounter for screening for malignant neoplasm of cervix: Secondary | ICD-10-CM | POA: Diagnosis not present

## 2023-07-10 NOTE — Patient Instructions (Addendum)
Black cohosh Soy Flaxseed  Luvena for vaginal dryness

## 2023-07-10 NOTE — Progress Notes (Unsigned)
   Subjective:    Patient ID: Emily Donaldson, female    DOB: 11/30/1965, 57 y.o.   MRN: [id number]  HPI Presents for her pelvic exam and Pap smear.  Recently seen by Dr. Adriana Simas on 05/22/23 for her physical.  Married, same female sexual partner.  No vaginal bleeding.  No pelvic pain.  Has experienced some hot flashes lately, is postmenopausal.  Also some slight vaginal dryness especially during intercourse.  Does not occur every time.  Defers need for STD testing.     07/10/2023    2:07 PM  Depression screen PHQ 2/9  Decreased Interest 0  Down, Depressed, Hopeless 0  PHQ - 2 Score 0  Altered sleeping 0  Tired, decreased energy 1  Change in appetite 0  Feeling bad or failure about yourself  0  Trouble concentrating 0  Moving slowly or fidgety/restless 0  Suicidal thoughts 0  PHQ-9 Score 1  Difficult doing work/chores Not difficult at all      07/10/2023    2:07 PM 05/22/2023    3:38 PM 11/16/2022    9:36 AM  GAD 7 : Generalized Anxiety Score  Nervous, Anxious, on Edge 0 0 0  Control/stop worrying 0 0 0  Worry too much - different things 0 0 0  Trouble relaxing 0 1 2  Restless 0 0 2  Easily annoyed or irritable 0 1 3  Afraid - awful might happen 0 0 3  Total GAD 7 Score 0 2 10  Anxiety Difficulty Not difficult at all Not difficult at all Not difficult at all         Objective:   Physical Exam NAD.  Alert, oriented.  External GU no rashes lesions or irritation noted.  Vagina light pink and moist.  No discharge.  Cervix normal limit in appearance.  Pap smear obtained.  No CMT.  Bimanual exam no tenderness or obvious masses, exam limited due to abdominal girth. Today's Vitals   07/10/23 1406  BP: 136/80  Pulse: 92  Temp: 97.7 F (36.5 C)  SpO2: 98%  Weight: 219 lb (99.3 kg)  Height: 5\' 4"  (1.626 m)   Body mass index is 37.59 kg/m.        Assessment & Plan:  Encounter for Papanicolaou smear for cervical cancer screening - Plan: IGP, Aptima HPV Pap smear results  pending. Discussed OTC measures for vaginal dryness. At her age is unlikely fluctuations in hormones causing hot flashes but given information on natural supplements.  Discussed the importance of stress reduction since this may cause similar symptoms.  Recheck if worsens or persist.  Repeat physical in 1 year.

## 2023-07-11 ENCOUNTER — Encounter: Payer: Self-pay | Admitting: Nurse Practitioner

## 2023-07-15 LAB — IGP, APTIMA HPV: HPV Aptima: NEGATIVE

## 2023-11-21 ENCOUNTER — Other Ambulatory Visit: Payer: Self-pay | Admitting: Family Medicine

## 2024-02-18 ENCOUNTER — Ambulatory Visit: Admitting: Family Medicine

## 2024-04-10 ENCOUNTER — Encounter: Payer: Self-pay | Admitting: Radiology

## 2024-04-13 DIAGNOSIS — I10 Essential (primary) hypertension: Secondary | ICD-10-CM | POA: Diagnosis not present

## 2024-04-13 DIAGNOSIS — Z13220 Encounter for screening for lipoid disorders: Secondary | ICD-10-CM | POA: Diagnosis not present

## 2024-05-25 ENCOUNTER — Other Ambulatory Visit: Payer: Self-pay | Admitting: Family Medicine

## 2024-06-22 ENCOUNTER — Encounter: Payer: Self-pay | Admitting: Radiology

## 2024-07-02 ENCOUNTER — Other Ambulatory Visit: Payer: Self-pay | Admitting: Family Medicine

## 2024-09-04 ENCOUNTER — Other Ambulatory Visit: Payer: Self-pay | Admitting: Family Medicine

## 2024-10-28 ENCOUNTER — Encounter: Admitting: Family Medicine
